# Patient Record
Sex: Male | Born: 1985 | Race: White | Hispanic: No | Marital: Single | Smoking: Current every day smoker
Health system: Southern US, Community
[De-identification: ages and names within clinical notes are randomized; demographics above are authoritative.]

## PROBLEM LIST (undated history)

## (undated) DIAGNOSIS — F3130 Bipolar disorder, current episode depressed, mild or moderate severity, unspecified: Secondary | ICD-10-CM

## (undated) DIAGNOSIS — F15950 Other stimulant use, unspecified with stimulant-induced psychotic disorder with delusions: Secondary | ICD-10-CM

## (undated) DIAGNOSIS — B1920 Unspecified viral hepatitis C without hepatic coma: Secondary | ICD-10-CM

## (undated) DIAGNOSIS — F319 Bipolar disorder, unspecified: Secondary | ICD-10-CM

## (undated) HISTORY — DX: Other stimulant use, unspecified with stimulant-induced psychotic disorder with delusions: F15.950

## (undated) HISTORY — PX: OTHER SURGICAL HISTORY: SHX169

## (undated) HISTORY — DX: Bipolar disorder, current episode depressed, mild or moderate severity, unspecified: F31.30

## (undated) HISTORY — PX: HERNIA REPAIR: SHX51

---

## 2020-08-15 ENCOUNTER — Emergency Department (HOSPITAL_BASED_OUTPATIENT_CLINIC_OR_DEPARTMENT_OTHER): Payer: Self-pay

## 2020-08-15 ENCOUNTER — Emergency Department (HOSPITAL_BASED_OUTPATIENT_CLINIC_OR_DEPARTMENT_OTHER)
Admission: EM | Admit: 2020-08-15 | Discharge: 2020-08-15 | Disposition: A | Discharge status: Home / Self Care | Payer: Self-pay | Attending: Emergency Medicine | Admitting: Emergency Medicine

## 2020-08-15 ENCOUNTER — Encounter (HOSPITAL_BASED_OUTPATIENT_CLINIC_OR_DEPARTMENT_OTHER): Payer: Self-pay | Admitting: Emergency Medicine

## 2020-08-15 ENCOUNTER — Other Ambulatory Visit: Payer: Self-pay

## 2020-08-15 DIAGNOSIS — F17200 Nicotine dependence, unspecified, uncomplicated: Secondary | ICD-10-CM | POA: Insufficient documentation

## 2020-08-15 DIAGNOSIS — L03211 Cellulitis of face: Secondary | ICD-10-CM | POA: Insufficient documentation

## 2020-08-15 LAB — CBC WITH DIFFERENTIAL/PLATELET
Abs Immature Granulocytes: 0.05 10*3/uL (ref 0.00–0.07)
Basophils Absolute: 0.1 10*3/uL (ref 0.0–0.1)
Basophils Relative: 0 %
Eosinophils Absolute: 0.1 10*3/uL (ref 0.0–0.5)
Eosinophils Relative: 0 %
HCT: 50.6 % (ref 39.0–52.0)
Hemoglobin: 16.6 g/dL (ref 13.0–17.0)
Immature Granulocytes: 0 %
Lymphocytes Relative: 16 %
Lymphs Abs: 2.2 10*3/uL (ref 0.7–4.0)
MCH: 28.5 pg (ref 26.0–34.0)
MCHC: 32.8 g/dL (ref 30.0–36.0)
MCV: 86.9 fL (ref 80.0–100.0)
Monocytes Absolute: 1.3 10*3/uL — ABNORMAL HIGH (ref 0.1–1.0)
Monocytes Relative: 9 %
Neutro Abs: 9.8 10*3/uL — ABNORMAL HIGH (ref 1.7–7.7)
Neutrophils Relative %: 75 %
Platelets: 192 10*3/uL (ref 150–400)
RBC: 5.82 MIL/uL — ABNORMAL HIGH (ref 4.22–5.81)
RDW: 13.5 % (ref 11.5–15.5)
WBC: 13.4 10*3/uL — ABNORMAL HIGH (ref 4.0–10.5)
nRBC: 0 % (ref 0.0–0.2)

## 2020-08-15 LAB — BASIC METABOLIC PANEL
Anion gap: 11 (ref 5–15)
BUN: 12 mg/dL (ref 6–20)
CO2: 29 mmol/L (ref 22–32)
Calcium: 9.6 mg/dL (ref 8.9–10.3)
Chloride: 96 mmol/L — ABNORMAL LOW (ref 98–111)
Creatinine, Ser: 0.83 mg/dL (ref 0.61–1.24)
GFR calc Af Amer: >60 mL/min (ref >60)
GFR calc non Af Amer: >60 mL/min (ref >60)
Glucose, Bld: 101 mg/dL — ABNORMAL HIGH (ref 70–99)
Potassium: 4.2 mmol/L (ref 3.5–5.1)
Sodium: 136 mmol/L (ref 135–145)

## 2020-08-15 MED ORDER — IOHEXOL 300 MG/ML  SOLN
100.0000 mL | Freq: Once | INTRAMUSCULAR | Status: AC | PRN
  Administered 2020-08-15: 75 mL via INTRAVENOUS

## 2020-08-15 MED ORDER — ACETAMINOPHEN 325 MG PO TABS
650.0000 mg | ORAL_TABLET | Freq: Once | ORAL | Status: AC
  Administered 2020-08-15: 650 mg via ORAL
  Filled 2020-08-15: qty 2

## 2020-08-15 MED ORDER — BENZOCAINE 20 % MT AERO
INHALATION_SPRAY | OROMUCOSAL | Status: AC
  Filled 2020-08-15: qty 57

## 2020-08-15 MED ORDER — CLINDAMYCIN PHOSPHATE 600 MG/50ML IV SOLN
600.0000 mg | Freq: Once | INTRAVENOUS | Status: AC
  Administered 2020-08-15: 600 mg via INTRAVENOUS
  Filled 2020-08-15: qty 50

## 2020-08-15 MED ORDER — LIDOCAINE-EPINEPHRINE 2 %-1:100000 IJ SOLN
INTRAMUSCULAR | Status: AC
  Filled 2020-08-15: qty 1.7

## 2020-08-15 MED ORDER — CLINDAMYCIN HCL 150 MG PO CAPS: 450.0000 mg | ORAL_CAPSULE | Freq: Three times a day (TID) | ORAL | 0 refills | Status: AC

## 2020-08-15 MED ORDER — OXYCODONE-ACETAMINOPHEN 5-325 MG PO TABS
1.0000 | ORAL_TABLET | Freq: Once | ORAL | Status: AC
  Administered 2020-08-15: 1 via ORAL
  Filled 2020-08-15: qty 1

## 2020-08-15 NOTE — ED Triage Notes (Addendum)
Redness and swelling around R eye and to the R cheek since Monday with pain. Pt states he cannot see the periphery of R eye. Pt reports that this could be cause by his bad teeth.

## 2020-08-15 NOTE — ED Provider Notes (Signed)
Medical screening examination/treatment/procedure(s) were conducted as a shared visit with non-physician practitioner(s) and myself.  I personally evaluated the patient during the encounter. Briefly, the patient is a 34 y.o. male patient with right-sided facial swelling for the last several days.  Patient does admit to meth use.  Has bad dentition on exam.  Multiple dental caries.  He has pus actively draining from a tooth with severe decay.  He has swelling up into the right cheek but does not have appear to involve the eye.  This is likely odontogenic infection.  We will get a CT scan to further evaluate and will have my PA attempt an I&D at the gumline did help relieve some more pus but will need dentistry follow-up.  Will give a dose of IV clindamycin and check basic labs but believe he will need his teeth pulled.  Does not appear to have any kind of deeper space infection but will confirm with CT scan.  Vital signs are normal.  Low concern for sepsis.  Anticipate discharge on antibiotics with dentistry follow-up.  This chart was dictated using voice recognition software.  Despite best efforts to proofread,  errors can occur which can change the documentation meaning.     EKG Interpretation None           Virgina Norfolk, DO 08/15/20 1438

## 2020-08-15 NOTE — Discharge Instructions (Signed)
You were seen in the ED for right facial swelling.  You have an abscess at the top of your tooth extending into your face.  This was drained today.  You were given antibiotics.  Please do warm/hot water and salt rinses every 2-3 hours and as much as possible.  Massage face and gumline under hot water/shower to help with drainage.  Sleep with your head elevated at night to help with swelling.  Take clindamycin 450 mg every 8 hours for infection.  Take 600 mg of ibuprofen every 6 hours for pain.  You can add 500 to 1000 mg of acetaminophen every 6-8 hours for more pain control.  Please follow-up with dentist as soon as possible to make sure this infection is healing, they may need to repeat the drainage procedure in the office.  Return to the emergency department if you develop fever greater than 100.4, worsening severe facial swelling that has involved the eye lid or eye tissues

## 2020-08-15 NOTE — ED Provider Notes (Signed)
[facility] EMERGENCY DEPARTMENT Provider Note   CSN: [id number] Arrival date & time: 08/15/20  1141     History Chief Complaint  Patient presents with  . Facial Swelling    Edward Wolfe is a 34 y.o. male With history of methamphetamine use presents to the ED for evaluation of right-sided facial and nasal swelling associated with pain, redness and warmth.  States he had a fever up until yesterday but this has resolved.  Has chills.  Has noticed some drainage coming from one of his teeth in the right upper gumline with surrounding gumline tenderness as well.  The pain is constant and severe and throbbing.  Attributes his poor dentition to using methamphetamine for years.  States he has been clean for approximately 2 months.  Denies nasal or eye drainage.  Denies pain with eye movements.  Has been taking extra strength Tylenol for pain.  No other associate symptoms.  HPI     History reviewed. No pertinent past medical history.  There are no problems to display for this patient.   History reviewed. No pertinent surgical history.     No family history on file.  Social History   Tobacco Use  . Smoking status: Current Every Day Smoker  . Smokeless tobacco: Never Used  Substance Use Topics  . Alcohol use: Yes  . Drug use: Yes    Types: Marijuana    Home Medications Prior to Admission medications   Medication Sig Start Date End Date Taking? Authorizing Provider  clindamycin (CLEOCIN) 150 MG capsule Take 3 capsules (450 mg total) by mouth 3 (three) times daily for 10 days. 08/15/20 08/25/20  Liberty Handy, PA-C    Allergies    Morphine and related  Review of Systems   Review of Systems  HENT: Positive for dental problem and facial swelling.   All other systems reviewed and are negative.   Physical Exam Updated Vital Signs BP 125/73 (BP Location: Right Arm)   Pulse 72   Temp 98.2 F (36.8 C) (Oral)   Resp 16   Ht 6' (1.829 m)   Wt 83.9 kg   SpO2  95%   BMI 25.09 kg/m   Physical Exam Vitals and nursing note reviewed.  Constitutional:      General: He is not in acute distress.    Appearance: He is well-developed.     Comments: NAD.  HENT:     Head: Normocephalic and atraumatic.     Right Ear: External ear normal.     Left Ear: External ear normal.     Nose: Nose normal.      Comments: Approximate 3 x 2 area of exquisite tenderness, erythema, warmth and fluctuance over right maxilla, slightly extending to the right inferior eyelid.  There is tenderness inside the right nare but no obvious signs of abscess intranasally.    Mouth/Throat:     Comments: Severely decayed teeth and poor dentition, several missing teeth.  Tooth #8 is essentially completely evaluated down to the root, pulp is exposed and exquisitely tender.  There is purulent drainage coming out of the base of this tooth.  Focal tenderness, fluctuance and edema of the gingiva at the base of tooth #8 noted. Eyes:     General: No scleral icterus.    Conjunctiva/sclera: Conjunctivae normal.     Comments: Very mild extension of cellulitis, edema from the right maxilla to the right inferior eyelid, just shy of the eyelashes.  Not involving the lid margins.  Full  range motion of the eyes without any pain.  Conjunctiva and sclera normal.  Cardiovascular:     Rate and Rhythm: Normal rate and regular rhythm.     Heart sounds: Normal heart sounds. No murmur heard.   Pulmonary:     Effort: Pulmonary effort is normal.     Breath sounds: Normal breath sounds. No wheezing.  Musculoskeletal:        General: No deformity. Normal range of motion.     Cervical back: Normal range of motion and neck supple.  Skin:    General: Skin is warm and dry.     Capillary Refill: Capillary refill takes less than 2 seconds.  Neurological:     Mental Status: He is alert and oriented to person, place, and time.  Psychiatric:        Behavior: Behavior normal.        Thought Content: Thought  content normal.        Judgment: Judgment normal.       ED Results / Procedures / Treatments   Labs (all labs ordered are listed, but only abnormal results are displayed) Labs Reviewed  CBC WITH DIFFERENTIAL/PLATELET - Abnormal; Notable for the following components:      Result Value   WBC 13.4 (*)    RBC 5.82 (*)    Neutro Abs 9.8 (*)    Monocytes Absolute 1.3 (*)    All other components within normal limits  BASIC METABOLIC PANEL - Abnormal; Notable for the following components:   Chloride 96 (*)    Glucose, Bld 101 (*)    All other components within normal limits    EKG None  Radiology CT Maxillofacial W Contrast  Result Date: 08/15/2020 CLINICAL DATA:  Maxillary/facial abscess. Right maxillary facial swelling. Purulent drainage. EXAM: CT MAXILLOFACIAL WITH CONTRAST TECHNIQUE: Multidetector CT imaging of the maxillofacial structures was performed with intravenous contrast. Multiplanar CT image reconstructions were also generated. CONTRAST:  33mL OMNIPAQUE IOHEXOL 300 MG/ML  SOLN COMPARISON:  None. FINDINGS: There is an approximately 0.9 x 1.7 x 1.4 (AP by transverse by craniocaudal) fluid collection overlying the right maxillary alveolar process, extending superiorly to the level of the inferior nasal arch, which is compatible with an abscess. There is extensive surrounding phlegmonous change as well as fat stranding/cellulitis that extends inferiorly into the right neck along the platysma which is thickened. Inflammatory change extends to the medial left orbit without evidence of postseptal cellulitis. No evidence of substantial floor mouth component. There multiple enlarged right greater than left lymph nodes, likely reactive. No evidence of suppurative change. There is extensive dental disease with numerous maxillary and mandibular periapical lucencies and caries. 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 The right second premolar  has a large periapical lucency with overlying cortical dehiscence (series 3, image 40). Mild inferior right maxillary sinus mucosal thickening, likely odontogenic given adjacent periapical lucencies. Otherwise, the sinuses are clear without air-fluid level. Limited intracranial evaluation demonstrates no significant or unexpected finding. IMPRESSION: 1. Findings consistent with a soft tissue abscess overlying the right maxillary alveolar process, as measured above. Extensive surrounding phlegmon and cellulitis which extends superiorly to the nasal bridge/medial canthus (without evidence of postseptal orbital cellulitis) and inferiorly into the subcutaneous soft tissues of the right upper neck. 2. Extensive dental disease with periapical lucencies, caries, and fragmented teeth. The right second premolar has a large periapical lucency with overlying cortical dehiscence, making it a candidate for the source of infection. 3. Likely odontogenic right  inferior maxillary sinus mucosal thickening. Critical Value/emergent results were called by telephone at the time of interpretation on 08/15/2020 at 3:18 pm to provider Dr. Lockie Mola, who verbally acknowledged these results. Electronically Signed   By: Feliberto Harts MD   On: 08/15/2020 15:23    Procedures Procedures (including critical care time)  Medications Ordered in ED Medications  lidocaine-EPINEPHrine (XYLOCAINE W/EPI) 2 %-1:100000 (with pres) injection (has no administration in time range)  Benzocaine (HURRCAINE) 20 % mouth spray (  Given by Other 08/15/20 1723)  iohexol (OMNIPAQUE) 300 MG/ML solution 100 mL (75 mLs Intravenous Contrast Given 08/15/20 1436)  oxyCODONE-acetaminophen (PERCOCET/ROXICET) 5-325 MG per tablet 1 tablet (1 tablet Oral Given 08/15/20 1444)  acetaminophen (TYLENOL) tablet 650 mg (650 mg Oral Given 08/15/20 1444)  clindamycin (CLEOCIN) IVPB 600 mg (0 mg Intravenous Stopped 08/15/20 1723)    ED Course  I have reviewed the triage  vital signs and the nursing notes.  Pertinent labs & imaging results that were available during my care of the patient were reviewed by me and considered in my medical decision making (see chart for details).    MDM Rules/Calculators/A&P                          34 year old male with history of methamphetamine use presents for right facial swelling.  Exam reveals moderate right maxillary swelling with exquisite tenderness, warmth and erythema consistent with cellulitis and likely abscess secondary to dental source.  He has extensive dental decay.  Maximum point of tenderness is about the canine/first right molar.  He has purulent drainage coming through tooth #8.  I have ordered lab work, CT maxillofacial.  I have ordered Percocet, clindamycin.  Lab work with very mild leukocytosis but no other evidence of SIRS.  Vital signs normal.  CT confirms abscess from odontogenic source.  Incision and drainage and dental block performed by me with complete anesthesia obtained if patient significant pain relief.  Significant amount of purulent drainage and blood expressed with improvement and facial swelling as well.  No immediate complications after no fever here in the ED.  We will discharge patient with warm water/salt rinses, high-dose NSAIDs, warm facial massage under shower, clindamycin.  I gave him Dr. Mart Piggs contact information and advised him to follow-up in the office in the next 48 hours for reevaluation to ensure that symptoms are improving.  Discussed specific reasons to return to the ED.  Today there is no evidence of preseptal or orbital cellulitis.  He has full range of motion of his eyes without any pain.  No entrapment.  Shared with EDP.  Final Clinical Impression(s) / ED Diagnoses Final diagnoses:  Facial cellulitis    Rx / DC Orders ED Discharge Orders         Ordered    clindamycin (CLEOCIN) 150 MG capsule  3 times daily        08/15/20 1713           Liberty Handy, PA-C 08/15/20 1723    Virgina Norfolk, DO 08/19/20 [phone]

## 2021-01-16 ENCOUNTER — Encounter (HOSPITAL_COMMUNITY): Payer: Self-pay

## 2021-01-16 ENCOUNTER — Telehealth (HOSPITAL_COMMUNITY): Payer: No Payment, Other | Admitting: Professional

## 2021-01-16 ENCOUNTER — Other Ambulatory Visit: Payer: Self-pay

## 2021-01-16 ENCOUNTER — Ambulatory Visit (HOSPITAL_COMMUNITY)
Admission: EM | Admit: 2021-01-16 | Discharge: 2021-01-17 | Disposition: A | Discharge status: Home / Self Care | Payer: No Payment, Other | Attending: Family | Admitting: Family

## 2021-01-16 ENCOUNTER — Telehealth (HOSPITAL_COMMUNITY): Payer: Self-pay | Admitting: Professional

## 2021-01-16 ENCOUNTER — Telehealth (HOSPITAL_COMMUNITY): Payer: Self-pay | Admitting: Psychiatry

## 2021-01-16 DIAGNOSIS — F15950 Other stimulant use, unspecified with stimulant-induced psychotic disorder with delusions: Secondary | ICD-10-CM | POA: Insufficient documentation

## 2021-01-16 DIAGNOSIS — F17200 Nicotine dependence, unspecified, uncomplicated: Secondary | ICD-10-CM | POA: Insufficient documentation

## 2021-01-16 DIAGNOSIS — F3130 Bipolar disorder, current episode depressed, mild or moderate severity, unspecified: Secondary | ICD-10-CM | POA: Insufficient documentation

## 2021-01-16 DIAGNOSIS — R45851 Suicidal ideations: Secondary | ICD-10-CM | POA: Insufficient documentation

## 2021-01-16 DIAGNOSIS — F1994 Other psychoactive substance use, unspecified with psychoactive substance-induced mood disorder: Secondary | ICD-10-CM | POA: Insufficient documentation

## 2021-01-16 DIAGNOSIS — F1290 Cannabis use, unspecified, uncomplicated: Secondary | ICD-10-CM | POA: Insufficient documentation

## 2021-01-16 DIAGNOSIS — Z20822 Contact with and (suspected) exposure to covid-19: Secondary | ICD-10-CM | POA: Insufficient documentation

## 2021-01-16 DIAGNOSIS — F332 Major depressive disorder, recurrent severe without psychotic features: Secondary | ICD-10-CM

## 2021-01-16 DIAGNOSIS — Z9151 Personal history of suicidal behavior: Secondary | ICD-10-CM | POA: Diagnosis not present

## 2021-01-16 DIAGNOSIS — F1520 Other stimulant dependence, uncomplicated: Secondary | ICD-10-CM | POA: Insufficient documentation

## 2021-01-16 LAB — MAGNESIUM: Magnesium: 2.1 mg/dL (ref 1.7–2.4)

## 2021-01-16 LAB — CBC WITH DIFFERENTIAL/PLATELET
Abs Immature Granulocytes: 0.03 10*3/uL (ref 0.00–0.07)
Basophils Absolute: 0.1 10*3/uL (ref 0.0–0.1)
Basophils Relative: 0 %
Eosinophils Absolute: 0.1 10*3/uL (ref 0.0–0.5)
Eosinophils Relative: 0 %
HCT: 53.9 % — ABNORMAL HIGH (ref 39.0–52.0)
Hemoglobin: 17.5 g/dL — ABNORMAL HIGH (ref 13.0–17.0)
Immature Granulocytes: 0 %
Lymphocytes Relative: 17 %
Lymphs Abs: 2.3 10*3/uL (ref 0.7–4.0)
MCH: 27.9 pg (ref 26.0–34.0)
MCHC: 32.5 g/dL (ref 30.0–36.0)
MCV: 86 fL (ref 80.0–100.0)
Monocytes Absolute: 0.9 10*3/uL (ref 0.1–1.0)
Monocytes Relative: 7 %
Neutro Abs: 9.9 10*3/uL — ABNORMAL HIGH (ref 1.7–7.7)
Neutrophils Relative %: 76 %
Platelets: 154 10*3/uL (ref 150–400)
RBC: 6.27 MIL/uL — ABNORMAL HIGH (ref 4.22–5.81)
RDW: 13.5 % (ref 11.5–15.5)
WBC: 13.2 10*3/uL — ABNORMAL HIGH (ref 4.0–10.5)
nRBC: 0 % (ref 0.0–0.2)

## 2021-01-16 LAB — COMPREHENSIVE METABOLIC PANEL
ALT: 31 U/L (ref 0–44)
AST: 25 U/L (ref 15–41)
Albumin: 4.7 g/dL (ref 3.5–5.0)
Alkaline Phosphatase: 52 U/L (ref 38–126)
Anion gap: 13 (ref 5–15)
BUN: 9 mg/dL (ref 6–20)
CO2: 27 mmol/L (ref 22–32)
Calcium: 9.9 mg/dL (ref 8.9–10.3)
Chloride: 100 mmol/L (ref 98–111)
Creatinine, Ser: 0.88 mg/dL (ref 0.61–1.24)
GFR, Estimated: >60 mL/min (ref >60)
Glucose, Bld: 77 mg/dL (ref 70–99)
Potassium: 4.4 mmol/L (ref 3.5–5.1)
Sodium: 140 mmol/L (ref 135–145)
Total Bilirubin: 0.9 mg/dL (ref 0.3–1.2)
Total Protein: 7.4 g/dL (ref 6.5–8.1)

## 2021-01-16 LAB — HEMOGLOBIN A1C
Hgb A1c MFr Bld: 5.3 % (ref 4.8–5.6)
Mean Plasma Glucose: 105.41 mg/dL

## 2021-01-16 LAB — POC SARS CORONAVIRUS 2 AG: SARS Coronavirus 2 Ag: NEGATIVE

## 2021-01-16 LAB — LIPID PANEL
Cholesterol: 236 mg/dL — ABNORMAL HIGH (ref 0–200)
HDL: 50 mg/dL (ref >40)
LDL Cholesterol: 157 mg/dL — ABNORMAL HIGH (ref 0–99)
Total CHOL/HDL Ratio: 4.7 RATIO
Triglycerides: 143 mg/dL (ref <150)
VLDL: 29 mg/dL (ref 0–40)

## 2021-01-16 LAB — RESP PANEL BY RT-PCR (FLU A&B, COVID) ARPGX2
Influenza A by PCR: NEGATIVE
Influenza B by PCR: NEGATIVE
SARS Coronavirus 2 by RT PCR: NEGATIVE

## 2021-01-16 LAB — TSH: TSH: 2.726 u[IU]/mL (ref 0.350–4.500)

## 2021-01-16 LAB — POCT URINE DRUG SCREEN - MANUAL ENTRY (I-SCREEN)
POC Amphetamine UR: NOT DETECTED
POC Buprenorphine (BUP): NOT DETECTED
POC Cocaine UR: NOT DETECTED
POC Marijuana UR: POSITIVE — AB
POC Methadone UR: NOT DETECTED
POC Methamphetamine UR: NOT DETECTED
POC Morphine: NOT DETECTED
POC Oxazepam (BZO): NOT DETECTED
POC Oxycodone UR: NOT DETECTED
POC Secobarbital (BAR): NOT DETECTED

## 2021-01-16 LAB — ETHANOL: Alcohol, Ethyl (B): <10 mg/dL (ref <10)

## 2021-01-16 MED ORDER — SERTRALINE HCL 50 MG PO TABS
50.0000 mg | ORAL_TABLET | Freq: Every day | ORAL | Status: DC
  Administered 2021-01-16 – 2021-01-17 (×2): 50 mg via ORAL
  Filled 2021-01-16 (×2): qty 1
  Filled 2021-01-16: qty 7

## 2021-01-16 MED ORDER — NICOTINE 14 MG/24HR TD PT24
14.0000 mg | MEDICATED_PATCH | Freq: Once | TRANSDERMAL | Status: DC
  Administered 2021-01-16: 14 mg via TRANSDERMAL
  Filled 2021-01-16: qty 1

## 2021-01-16 MED ORDER — TRAZODONE HCL 50 MG PO TABS
50.0000 mg | ORAL_TABLET | Freq: Every evening | ORAL | Status: DC | PRN
  Administered 2021-01-16: 50 mg via ORAL
  Filled 2021-01-16: qty 7
  Filled 2021-01-16: qty 1

## 2021-01-16 MED ORDER — MAGNESIUM HYDROXIDE 400 MG/5ML PO SUSP: 30.0000 mL | Freq: Every day | ORAL | Status: DC | PRN

## 2021-01-16 MED ORDER — ALUM & MAG HYDROXIDE-SIMETH 200-200-20 MG/5ML PO SUSP: 30.0000 mL | ORAL | Status: DC | PRN

## 2021-01-16 MED ORDER — HYDROXYZINE HCL 25 MG PO TABS
25.0000 mg | ORAL_TABLET | Freq: Three times a day (TID) | ORAL | Status: DC | PRN
  Administered 2021-01-16: 25 mg via ORAL
  Filled 2021-01-16: qty 1

## 2021-01-16 MED ORDER — ACETAMINOPHEN 325 MG PO TABS: 650.0000 mg | ORAL_TABLET | Freq: Four times a day (QID) | ORAL | Status: DC | PRN

## 2021-01-16 NOTE — ED Provider Notes (Signed)
Behavioral Health Admission [facility] & OBS)  Date: 01/16/21 Patient Name: Edward Wolfe MRN: [id number] Chief Complaint:  Chief Complaint  Patient presents with  . Addiction Problem  . Suicidal      Diagnoses:  Final diagnoses:  Substance induced mood disorder (HCC)    HPI: Patient presents voluntarily to [facility] behavioral health center for walk-in assessment. Patient endorses suicidal ideations with a plan to hang himself.  Patient endorses 2 prior suicide attempts in the past.  Patient endorses history of self-harm, cutting in the past.  Patient reports recent stressors include unemployment and not getting along well with roommate.  Patient reports also he continues to grieve the death of his grandmother who passed away approximately 1 year ago.  Patient reports recent break-up with boyfriend, reports this boyfriend was the cause for patient's relapse on methamphetamine.  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  Patient reports relapse on methamphetamine approximately 3 weeks ago.  Patient denies any current outpatient psychiatry follow-up.  Patient reports he has been followed outpatient by psychiatry, reports Zoloft has worked well for him in the past.  Patient denies any history of substance use treatment.  Patient resides in [place] with a roommate.  Patient denies access to weapons.  Patient is currently not employed but typically works in the [employer].  Patient endorses daily use of marijuana.  Patient endorses rare use of alcohol.  Patient endorses decreased sleep and average appetite.  Patient assessed by nurse practitioner.  Patient alert and oriented, answers appropriately.  Patient pleasant cooperative during assessment.  Patient denies homicidal ideations.  Patient denies both auditory and visual hallucinations.  There is no evidence of delusional thought content and no indication that patient is responding to internal stimuli.  Patient  denies symptoms of paranoia.  Patient offered support and encouragement.  PHQ 2-9:  Flowsheet Row ED from 01/16/2021 in [facility]  Thoughts that you would be better off dead, or of hurting yourself in some way Several days  [Phreesia 01/16/2021]  PHQ-9 Total Score 9      Flowsheet Row ED from 01/16/2021 in [facility]  C-SSRS RISK CATEGORY High Risk       Total Time spent with patient: 30 minutes  Musculoskeletal  Strength & Muscle Tone: within normal limits Gait & Station: normal Patient leans: N/A  Psychiatric Specialty Exam  Presentation General Appearance: Appropriate for Environment; Casual  Eye Contact:Good  Speech:Clear and Coherent; Normal Rate  Speech Volume:Normal  Handedness:Right   Mood and Affect  Mood:Depressed  Affect:Congruent; Depressed   Thought Process  Thought Processes:Coherent; Goal Directed  Descriptions of Associations:Intact  Orientation:Full (Time, Place and Person)  Thought Content:Logical  Hallucinations:Hallucinations: None  Ideas of Reference:None  Suicidal Thoughts:Suicidal Thoughts: Yes, Active SI Active Intent and/or Plan: With Intent; With Plan; With Access to Means  Homicidal Thoughts:Homicidal Thoughts: No   Sensorium  Memory:Immediate Good; Recent Good; Remote Good  Judgment:Fair  Insight:Fair   Executive Functions  Concentration:Good  Attention Span:Good  Recall:Good  Fund of Knowledge:Good  Language:Good   Psychomotor Activity  Psychomotor Activity:Psychomotor Activity: Normal   Assets  Assets:Communication Skills; Desire for Improvement; Financial Resources/Insurance; Intimacy; Leisure Time; Physical Health; Resilience; Social Support   Sleep  Sleep:Sleep: Fair   Physical Exam Vitals and nursing note reviewed.  Constitutional:      Appearance: He is well-developed.  HENT:     Head: Normocephalic.  Cardiovascular:     Rate  and Rhythm: Normal  rate.  Pulmonary:     Effort: Pulmonary effort is normal.  Neurological:     Mental Status: He is alert and oriented to person, place, and time.  Psychiatric:        Attention and Perception: Attention and perception normal.        Mood and Affect: Affect normal. Mood is depressed.        Speech: Speech normal.        Behavior: Behavior normal. Behavior is cooperative.        Thought Content: Thought content includes suicidal ideation. Thought content includes suicidal plan.        Cognition and Memory: Cognition and memory normal.        Judgment: Judgment normal.    Review of Systems  Constitutional: Negative.   HENT: Negative.   Eyes: Negative.   Respiratory: Negative.   Cardiovascular: Negative.   Gastrointestinal: Negative.   Genitourinary: Negative.   Musculoskeletal: Negative.   Skin: Negative.   Neurological: Negative.   Endo/Heme/Allergies: Negative.   Psychiatric/Behavioral: Positive for depression, substance abuse and suicidal ideas.    Blood pressure 129/77, pulse 78, temperature 98.5 F (36.9 C), temperature source Oral, resp. rate 20, SpO2 100 %. There is no height or weight on file to calculate BMI.  Past Psychiatric History: Methamphetamine use disorder  Is the patient at risk to self? Yes  Has the patient been a risk to self in the past 6 months? No .    Has the patient been a risk to self within the distant past? Yes   Is the patient a risk to others? No   Has the patient been a risk to others in the past 6 months? No   Has the patient been a risk to others within the distant past? No   Past Medical History: History reviewed. No pertinent past medical history. History reviewed. No pertinent surgical history.  Family History: History reviewed. No pertinent family history.  Social History:  Social History   Socioeconomic History  . Marital status: Single    Spouse name: Not on file  . Number of children: Not on file  . Years of  education: Not on file  . Highest education level: Not on file  Occupational History  . Not on file  Tobacco Use  . Smoking status: Current Every Day Smoker  . Smokeless tobacco: Never Used  Substance and Sexual Activity  . Alcohol use: Yes  . Drug use: Yes    Types: Marijuana  . Sexual activity: Not on file  Other Topics Concern  . Not on file  Social History Narrative  . Not on file   Social Determinants of Health   Financial Resource Strain: Not on file  Food Insecurity: Not on file  Transportation Needs: Not on file  Physical Activity: Not on file  Stress: Not on file  Social Connections: Not on file  Intimate Partner Violence: Not on file    SDOH:  SDOH Screenings   Alcohol Screen: Not on file  Depression (PHQ2-9): Medium Risk  . PHQ-2 Score: 9  Financial Resource Strain: Not on file  Food Insecurity: Not on file  Housing: Not on file  Physical Activity: Not on file  Social Connections: Not on file  Stress: Not on file  Tobacco Use: High Risk  . Smoking Tobacco Use: Current Every Day Smoker  . Smokeless Tobacco Use: Never Used  Transportation Needs: Not on file    Last Labs:  Admission on 01/16/2021  Component Date Value Ref Range Status  . POC Amphetamine UR 01/16/2021 None Detected  NONE DETECTED (Cut Off Level 1000 ng/mL) Final  . POC Secobarbital (BAR) 01/16/2021 None Detected  NONE DETECTED (Cut Off Level 300 ng/mL) Final  . POC Buprenorphine (BUP) 01/16/2021 None Detected  NONE DETECTED (Cut Off Level 10 ng/mL) Final  . POC Oxazepam (BZO) 01/16/2021 None Detected  NONE DETECTED (Cut Off Level 300 ng/mL) Final  . POC Cocaine UR 01/16/2021 None Detected  NONE DETECTED (Cut Off Level 300 ng/mL) Final  . POC Methamphetamine UR 01/16/2021 None Detected  NONE DETECTED (Cut Off Level 1000 ng/mL) Final  . POC Morphine 01/16/2021 None Detected  NONE DETECTED (Cut Off Level 300 ng/mL) Final  . POC Oxycodone UR 01/16/2021 None Detected  NONE DETECTED (Cut Off  Level 100 ng/mL) Final  . POC Methadone UR 01/16/2021 None Detected  NONE DETECTED (Cut Off Level 300 ng/mL) Final  . POC Marijuana UR 01/16/2021 Positive* NONE DETECTED (Cut Off Level 50 ng/mL) Final  . SARS Coronavirus 2 Ag 01/16/2021 NEGATIVE  NEGATIVE Final   Comment: (NOTE) SARS-CoV-2 antigen NOT DETECTED.   Negative results are presumptive.  Negative results do not preclude SARS-CoV-2 infection and should not be used as the sole basis for treatment or other patient management decisions, including infection  control decisions, particularly in the presence of clinical signs and  symptoms consistent with COVID-19, or in those who have been in contact with the virus.  Negative results must be combined with clinical observations, patient history, and epidemiological information. The expected result is Negative.  Fact Sheet for Patients: https://www.jennings-kim.com/  Fact Sheet for Healthcare Providers: https://alexander-rogers.biz/  This test is not yet approved or cleared by the [place] FDA and  has been authorized for detection and/or diagnosis of SARS-CoV-2 by FDA under an Emergency Use Authorization (EUA).  This EUA will remain in effect (meaning this test can be used) for the duration of  the COV                          ID-19 declaration under Section 564(b)(1) of the Act, 21 U.S.C. section 360bbb-3(b)(1), unless the authorization is terminated or revoked sooner.    Admission on 08/15/2020, Discharged on 08/15/2020  Component Date Value Ref Range Status  . WBC 08/15/2020 13.4* 4.0 - 10.5 K/uL Final  . RBC 08/15/2020 5.82* 4.22 - 5.81 MIL/uL Final  . Hemoglobin 08/15/2020 16.6  13.0 - 17.0 g/dL Final  . HCT 02/77/4128 50.6  39.0 - 52.0 % Final  . MCV 08/15/2020 86.9  80.0 - 100.0 fL Final  . MCH 08/15/2020 28.5  26.0 - 34.0 pg Final  . MCHC 08/15/2020 32.8  30.0 - 36.0 g/dL Final  . RDW 78/67/6720 13.5  11.5 - 15.5 % Final  . Platelets  08/15/2020 192  150 - 400 K/uL Final  . nRBC 08/15/2020 0.0  0.0 - 0.2 % Final  . Neutrophils Relative % 08/15/2020 75  % Final  . Neutro Abs 08/15/2020 9.8* 1.7 - 7.7 K/uL Final  . Lymphocytes Relative 08/15/2020 16  % Final  . Lymphs Abs 08/15/2020 2.2  0.7 - 4.0 K/uL Final  . Monocytes Relative 08/15/2020 9  % Final  . Monocytes Absolute 08/15/2020 1.3* 0.1 - 1.0 K/uL Final  . Eosinophils Relative 08/15/2020 0  % Final  . Eosinophils Absolute 08/15/2020 0.1  0.0 - 0.5 K/uL Final  . Basophils Relative 08/15/2020 0  % Final  .  Basophils Absolute 08/15/2020 0.1  0.0 - 0.1 K/uL Final  . Immature Granulocytes 08/15/2020 0  % Final  . Abs Immature Granulocytes 08/15/2020 0.05  0.00 - 0.07 K/uL Final   Performed at [facility], [address] [place], [place] [address]  . Sodium 08/15/2020 136  135 - 145 mmol/L Final  . Potassium 08/15/2020 4.2  3.5 - 5.1 mmol/L Final  . Chloride 08/15/2020 96* 98 - 111 mmol/L Final  . CO2 08/15/2020 29  22 - 32 mmol/L Final  . Glucose, Bld 08/15/2020 101* 70 - 99 mg/dL Final   Glucose reference range applies only to samples taken after fasting for at least 8 hours.  . BUN 08/15/2020 12  6 - 20 mg/dL Final  . Creatinine, Ser 08/15/2020 0.83  0.61 - 1.24 mg/dL Final  . Calcium 59/29/2446 9.6  8.9 - 10.3 mg/dL Final  . GFR calc non Af Amer 08/15/2020 >60  >60 mL/min Final  . GFR calc Af Amer 08/15/2020 >60  >60 mL/min Final  . Anion gap 08/15/2020 11  5 - 15 Final   Performed at [facility], [address] [place], [place] [address]    Allergies: Morphine and related  PTA Medications: (Not in a hospital admission)   Medical Decision Making  Discussed initiating Zoloft to address mood.  Discussed side effects, patient agrees with plan.  Patient requests nicotine patch as he is currently on every day smoker.  Medications: -Zoloft 50 mg daily -Nicotine transdermal 14 mg daily   EKG completed, QTC 497. Recommendations   Based on my evaluation the patient does not appear to have an emergency medical condition.  Patient reviewed with Dr. Bronwen Betters.  Patient will be placed in the continuous assessment area at [facility] for treatment and stabilization.  Patient will be reevaluated tomorrow, disposition will be determined at that time.   Patrcia Dolly, FNP 01/16/21  7:02 PM

## 2021-01-16 NOTE — Telephone Encounter (Signed)
Patient called [facility] around 3pm to check on his appointment at 2pm. He stated that he had been waiting to connect with a therapist to have an assessment done. Writer informed patient that therapist reached out to patient by phone, as well as waiting on MyChart video to connect. Patient never connected and writer was informed to mark appointment as a "no show". Writer offered patient to reschedule for an appointment next week or to walk in to [facility] as he stated that he was in crisis and was "ready to end it all because I tired of it all". Patient stated he had no mode of transportation. Writer stayed connected with patient as colleague contacted mobile crisis unit. Mobile crisis unit stated that there would be a two hour wait until someone could reach patient location. Writer then informed colleague to hang up and call 911. Officers were immediately sent to patients location, as Clinical research associate stayed on the phone until officers were heard arriving. Patient begin speaking after officers arrived and let us know he was safe.

## 2021-01-16 NOTE — ED Notes (Signed)
Pt A&O x 4, resting atpresent, no distress noted, calm & cooperative.  Remains SsI, monitoring for safety.

## 2021-01-16 NOTE — ED Notes (Signed)
Patient is awake, quite and relaxing

## 2021-01-16 NOTE — ED Triage Notes (Signed)
34 yo male bought in by [employer] with complaints stating, "I am having suicidal thoughts and I can't shake that feeling. I've had so many problems in my life that I don't know where to begin.  My grandmother died last year and I relapsed on crystal meth on New Years. I've not used for about 3 weeks now but I feel manicy". Pt endorse SI no plan, denies HI/AVH.
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
## 2021-01-17 ENCOUNTER — Ambulatory Visit (INDEPENDENT_AMBULATORY_CARE_PROVIDER_SITE_OTHER): Payer: No Payment, Other | Admitting: Licensed Clinical Social Worker

## 2021-01-17 ENCOUNTER — Other Ambulatory Visit: Payer: Self-pay | Admitting: Psychiatry

## 2021-01-17 ENCOUNTER — Encounter (HOSPITAL_COMMUNITY): Payer: Self-pay | Admitting: Licensed Clinical Social Worker

## 2021-01-17 DIAGNOSIS — F3162 Bipolar disorder, current episode mixed, moderate: Secondary | ICD-10-CM | POA: Diagnosis not present

## 2021-01-17 DIAGNOSIS — F1994 Other psychoactive substance use, unspecified with psychoactive substance-induced mood disorder: Secondary | ICD-10-CM | POA: Diagnosis not present

## 2021-01-17 MED ORDER — TRAZODONE HCL 50 MG PO TABS: 50.0000 mg | ORAL_TABLET | Freq: Every evening | ORAL | 0 refills | Status: DC | PRN

## 2021-01-17 MED ORDER — SERTRALINE HCL 50 MG PO TABS: 50.0000 mg | ORAL_TABLET | Freq: Every day | ORAL | 0 refills | Status: DC
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
## 2021-01-17 NOTE — ED Notes (Signed)
Breakfast given: grits

## 2021-01-17 NOTE — Discharge Instructions (Addendum)
Patient is instructed prior to discharge to: Take all medications as prescribed by his/her mental healthcare provider. Report any adverse effects and or reactions from the medicines to his/her outpatient provider promptly. Patient has been instructed & cautioned: To not engage in alcohol and or illegal drug use while on prescription medicines. In the event of worsening symptoms, patient is instructed to call the crisis hotline, 911 and or go to the nearest ED for appropriate evaluation and treatment of symptoms. To follow-up with his/her primary care provider for your other medical issues, concerns and or health care needs.   May also use [place] pharmacy for prescriptions

## 2021-01-17 NOTE — ED Notes (Signed)
Pt sleeping at present, no distress noted, monitoring for safety. 

## 2021-01-18 LAB — PROLACTIN: Prolactin: 11.8 ng/mL (ref 4.0–15.2)

## 2021-01-22 MED FILL — SERTRALINE HCL 50 MG TABLET: 50 | 30 days supply | Qty: 30 | Fill #0

## 2021-01-22 MED FILL — traZODone HCL 50 MG TABS: 50 | 30 days supply | Qty: 30 | Fill #0

## 2021-01-27 ENCOUNTER — Ambulatory Visit (INDEPENDENT_AMBULATORY_CARE_PROVIDER_SITE_OTHER): Payer: No Payment, Other | Admitting: Behavioral Health

## 2021-01-27 ENCOUNTER — Other Ambulatory Visit: Payer: Self-pay

## 2021-01-27 DIAGNOSIS — F1520 Other stimulant dependence, uncomplicated: Secondary | ICD-10-CM

## 2021-01-28 NOTE — Progress Notes (Signed)
   THERAPIST PROGRESS NOTE  Session Time: 2:00PM  Participation Level: Active  Behavioral Response: Well GroomedAlertPleasant  Type of Therapy: SAIOP Intake/Individual Therapy  Treatment Goals addressed: Diagnosis: Substance Use  Interventions: Motivational Interviewing  Summary: Edward Wolfe is a 35 y.o. male who [facility] to [facility] for a scheduled intake for SAIOP. [name] was recommended for SAIOP to address his substance use disorder. [name] presents alert and oriented x4; however, clt appeared to be depressed in his mood. [name] reported to this writer that he recently relapsed using meth on 01/24/2021. He reports using "a couple of bowls" with his boyfriend. [name] denied having any current withdrawal symptoms. He did not appear to be at immediate risk for withdrawal and elected to move forward with enrollment into SAIOP. [name] shared that he is only triggered to use when he is around his boyfriend. [name] states his boyfriend has not agreed to stop using substances nor has he agreed to seek treatment for his substance use. [name] shared that this puts him in a "tough space" because he wants to continue a relationship with his partner but understands that his relationship will continue to impede his progress towards recovery. [name] appeared to be somewhat tearful as he thought about needing to make the decision of staying with his boyfriend or seeking recovery for himself. He also talked about the things his addiction has damaged in his life such as his relationships with his family and his overall health and appearance. [name] is also in the process of seeking full time employment. He shared that he recently had an interview with [employer].   Suicidal/Homicidal: No  Therapist Response: Therapist offered encouragement and support. Therapist discussed the goal of SAIOP and group rules. Therapist discussed treatment goals and developed a comprehensive crisis plan.  Plan: Return to SAIOP - M, W, F - 9am  - 12pm.  Diagnosis: Axis I: Substance Abuse    Axis II: No diagnosis    Mamie Nick, Counselor 01/28/2021

## 2021-01-29 ENCOUNTER — Other Ambulatory Visit (HOSPITAL_COMMUNITY): Payer: Self-pay | Admitting: Medical

## 2021-01-29 ENCOUNTER — Other Ambulatory Visit: Payer: Self-pay

## 2021-01-29 ENCOUNTER — Ambulatory Visit (INDEPENDENT_AMBULATORY_CARE_PROVIDER_SITE_OTHER): Payer: No Payment, Other | Admitting: Behavioral Health

## 2021-01-29 DIAGNOSIS — K009 Disorder of tooth development, unspecified: Secondary | ICD-10-CM

## 2021-01-29 DIAGNOSIS — F1520 Other stimulant dependence, uncomplicated: Secondary | ICD-10-CM

## 2021-01-29 DIAGNOSIS — F1924 Other psychoactive substance dependence with psychoactive substance-induced mood disorder: Secondary | ICD-10-CM | POA: Diagnosis not present

## 2021-01-29 DIAGNOSIS — K029 Dental caries, unspecified: Secondary | ICD-10-CM

## 2021-01-29 DIAGNOSIS — F19982 Other psychoactive substance use, unspecified with psychoactive substance-induced sleep disorder: Secondary | ICD-10-CM | POA: Diagnosis not present

## 2021-01-29 DIAGNOSIS — T7422XS Child sexual abuse, confirmed, sequela: Secondary | ICD-10-CM

## 2021-01-29 DIAGNOSIS — F4312 Post-traumatic stress disorder, chronic: Secondary | ICD-10-CM | POA: Diagnosis not present

## 2021-01-29 DIAGNOSIS — Z639 Problem related to primary support group, unspecified: Secondary | ICD-10-CM

## 2021-01-29 MED ORDER — SERTRALINE HCL 100 MG PO TABS
100.0000 mg | ORAL_TABLET | Freq: Every day | ORAL | 2 refills | Status: DC
Start: 2021-01-29 — End: 2021-01-29

## 2021-01-29 MED ORDER — TRAZODONE HCL 100 MG PO TABS
100.0000 mg | ORAL_TABLET | Freq: Every evening | ORAL | 2 refills | Status: DC | PRN
Start: 2021-01-29 — End: 2021-01-29
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
## 2021-01-31 ENCOUNTER — Other Ambulatory Visit (HOSPITAL_COMMUNITY): Payer: Self-pay | Admitting: Medical

## 2021-01-31 ENCOUNTER — Other Ambulatory Visit: Payer: Self-pay

## 2021-01-31 ENCOUNTER — Ambulatory Visit (HOSPITAL_COMMUNITY): Payer: No Payment, Other | Admitting: Behavioral Health

## 2021-01-31 DIAGNOSIS — F1520 Other stimulant dependence, uncomplicated: Secondary | ICD-10-CM

## 2021-01-31 MED ORDER — BACLOFEN 10 MG PO TABS: 10.0000 mg | ORAL_TABLET | Freq: Three times a day (TID) | ORAL | 1 refills | Status: DC

## 2021-01-31 MED FILL — BACLOFEN 10 MG TABLET: 10 | 30 days supply | Qty: 90 | Fill #0

## 2021-01-31 NOTE — Progress Notes (Signed)
Brief encounter to discuss MAT for Meth and Disease/Brain disorder of addiction ie Dopamine/instinctual brain (rat brain) receptor damage and triggers.  Recommend MAT with Baclofen as it originally was discovered to be effective with crack cocaine triggers.He accepts and rx sent today FU Monday

## 2021-02-03 ENCOUNTER — Encounter (HOSPITAL_COMMUNITY): Payer: Self-pay | Admitting: Behavioral Health

## 2021-02-03 ENCOUNTER — Ambulatory Visit (HOSPITAL_COMMUNITY): Payer: Self-pay | Admitting: Medical

## 2021-02-03 NOTE — Group Note (Signed)
Group Topic: Healthy Relationships  Group Date: 01/31/2021 Start Time:  9:00 AM End Time: 10:00 AM Facilitators: Mamie Nick, Counselor  Department: [facility]  Number of Participants: 1  Group Focus: acceptance, anxiety, coping skills, dual diagnosis, feeling awareness/expression, healthy friendships, relapse prevention, and self-esteem Treatment Modality:  Cognitive Behavioral Therapy Interventions utilized were clarification, confrontation, and exploration Purpose: enhance coping skills, explore maladaptive thinking, express feelings, increase insight, regain self-worth, reinforce self-care, and relapse prevention strategies   Name: Edward Wolfe Date of Birth: [birth year]  MR: [id number]    Level of Participation: active Quality of Participation: attentive and cooperative Interactions with others: gave feedback Mood/Affect: appropriate Triggers (if applicable): N/A Cognition: coherent/clear and insightful Progress: Minimal Response: [name] checked-in by sharing he is doing well. He reports he is still battling with his decision to end things with his boyfriend. He reports knowing that this will be the best thing for him and his goal of recovery. He reports he is triggered to use meth whenever he goes around his boyfriend. [name] identified his triggers and ways he can exist in a world full of triggers. [name] shared that he has a goal of making amends with his mother and sister. He engaged well in the session today. Plan: follow-up needed  Patients Problems:  Patient Active Problem List   Diagnosis Date Noted  . Bipolar 1 disorder, mixed, moderate (HCC) 01/17/2021  . Substance induced mood disorder (HCC) 01/17/2021  . Amphetamine and psychostimulant-induced psychosis with delusions (HCC)   . Bipolar I disorder, most recent episode depressed [facility])      Family Program: Family present? No   Name of family member(s): N/A  UDS collected: No Results:  N/A  AA/NA attended?: No  Sponsor?: NA

## 2021-02-03 NOTE — Progress Notes (Signed)
Pt called in sick.Will reschedule to see Weds

## 2021-02-05 ENCOUNTER — Other Ambulatory Visit: Payer: Self-pay

## 2021-02-05 ENCOUNTER — Ambulatory Visit (INDEPENDENT_AMBULATORY_CARE_PROVIDER_SITE_OTHER): Payer: No Payment, Other | Admitting: Behavioral Health

## 2021-02-05 ENCOUNTER — Encounter (HOSPITAL_COMMUNITY): Payer: Self-pay | Admitting: Medical

## 2021-02-05 DIAGNOSIS — F19982 Other psychoactive substance use, unspecified with psychoactive substance-induced sleep disorder: Secondary | ICD-10-CM

## 2021-02-05 DIAGNOSIS — F1520 Other stimulant dependence, uncomplicated: Secondary | ICD-10-CM

## 2021-02-05 DIAGNOSIS — F4312 Post-traumatic stress disorder, chronic: Secondary | ICD-10-CM

## 2021-02-05 DIAGNOSIS — F341 Dysthymic disorder: Secondary | ICD-10-CM

## 2021-02-05 DIAGNOSIS — F1994 Other psychoactive substance use, unspecified with psychoactive substance-induced mood disorder: Secondary | ICD-10-CM

## 2021-02-05 DIAGNOSIS — T7422XS Child sexual abuse, confirmed, sequela: Secondary | ICD-10-CM

## 2021-02-05 DIAGNOSIS — F1924 Other psychoactive substance dependence with psychoactive substance-induced mood disorder: Secondary | ICD-10-CM

## 2021-02-05 NOTE — Progress Notes (Signed)
FU to complete Orientation to Disease Concept and Initial evaluation completed

## 2021-02-07 ENCOUNTER — Ambulatory Visit (INDEPENDENT_AMBULATORY_CARE_PROVIDER_SITE_OTHER): Payer: No Payment, Other | Admitting: Behavioral Health

## 2021-02-07 ENCOUNTER — Ambulatory Visit (HOSPITAL_COMMUNITY): Payer: Self-pay

## 2021-02-07 ENCOUNTER — Other Ambulatory Visit: Payer: Self-pay

## 2021-02-07 DIAGNOSIS — F1520 Other stimulant dependence, uncomplicated: Secondary | ICD-10-CM | POA: Diagnosis not present

## 2021-02-10 ENCOUNTER — Ambulatory Visit (HOSPITAL_COMMUNITY): Payer: Self-pay

## 2021-02-10 ENCOUNTER — Ambulatory Visit (INDEPENDENT_AMBULATORY_CARE_PROVIDER_SITE_OTHER): Payer: No Payment, Other | Admitting: Behavioral Health

## 2021-02-10 ENCOUNTER — Encounter (HOSPITAL_COMMUNITY): Payer: Self-pay | Admitting: Behavioral Health

## 2021-02-10 ENCOUNTER — Other Ambulatory Visit: Payer: Self-pay

## 2021-02-10 DIAGNOSIS — F1520 Other stimulant dependence, uncomplicated: Secondary | ICD-10-CM | POA: Diagnosis not present

## 2021-02-10 MED FILL — SERTRALINE HCL 100 MG TAB: 100 | 30 days supply | Qty: 30 | Fill #0

## 2021-02-10 MED FILL — TRAZODONE HCL 100 MG TABS: 100 | 30 days supply | Qty: 30 | Fill #0
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
## 2021-02-10 NOTE — Group Note (Signed)
Group Topic: Boundaries  Group Date: 02/07/2021 Start Time:  9:00 AM End Time: 12:00 PM Facilitators: Mamie Nick, Counselor  Department: [facility]  Number of Participants: 2  Group Focus: acceptance, anxiety, co-dependency, coping skills, depression, dual diagnosis, family, feeling awareness/expression, forgiveness, loss/grief issues, and relapse prevention Treatment Modality:  Cognitive Behavioral Therapy Interventions utilized were clarification, confrontation, exploration, and group exercise Purpose: enhance coping skills, explore maladaptive thinking, express feelings, express irrational fears, increase insight, regain self-worth, relapse prevention strategies, and trigger / craving management   Name: Edward Wolfe Date of Birth: 11/11/1986  MR: [id number]    Level of Participation: active Quality of Participation: attentive and cooperative Interactions with others: gave feedback Mood/Affect: appropriate Triggers (if applicable): N/A Cognition: coherent/clear Progress: Minimal Response: [name] checked in by sharing that he did not get a good night's rest last night. He presented to group with his work clothes on from the previous night. He rated his depression and anxiety both a ZERO. He reports he got into and argument with his ex-boyfriend and this triggered an urge to use. He self-reported that he "hit it once" (referring to smoking meth). He was asked to process this relapse. He provided minimal response to this question. [name] was very restless during group as he was very fidgety and appeared to have difficulty sitting still. Another group members asked clt if he was having currently thoughts of using and clt reported "yes". [name] processed these thoughts and feelings openly in amongst his peers. [name] also disclosed that he has an 18yo son that he has not seen in 18 years since the death of his son's mother. [name] became tearful as he shared this information. He  was receptive to the support offered by his peers. Plan: follow-up needed  Patients Problems:  Patient Active Problem List   Diagnosis Date Noted  . Bipolar 1 disorder, mixed, moderate (HCC) 01/17/2021  . Substance induced mood disorder (HCC) 01/17/2021  . Amphetamine and psychostimulant-induced psychosis with delusions (HCC)   . Bipolar I disorder, most recent episode depressed [facility])      Family Program: Family present? No   Name of family member(s): N/A  UDS collected: No Results: N/A  AA/NA attended?: No  Sponsor?: No

## 2021-02-11 NOTE — Group Note (Addendum)
Group Topic: Feelings and Emotions  Group Date: 02/05/2021 Start Time:  9:00 AM End Time: 12:00 PM Facilitators: Mamie Nick, Counselor  Department: [facility]  Number of Participants: 3  Group Focus: acceptance, anger management, anxiety, co-dependency, coping skills, dual diagnosis, family, relapse prevention, and self-awareness Treatment Modality:  Cognitive Behavioral Therapy Interventions utilized were clarification, confrontation, exploration, and group exercise Purpose: enhance coping skills, explore maladaptive thinking, express feelings, increase insight, relapse prevention strategies, and trigger / craving management   Name: Edward Wolfe Date of Birth: 1986/05/24  MR: [id number]    Level of Participation: active Quality of Participation: attentive and cooperative Interactions with others: gave feedback Mood/Affect: appropriate Triggers (if applicable): N/A Cognition: coherent/clear Progress: Gaining insight Response: [name] checked-in by sharing that he decided to end his relationship with his boyfriend after a heated argument. He reports he felt extremely disrespected when his boyfriend became verbally aggressive. [name] rated his depression and anxiety at a 10 on a scale 0 to 10, with 10 being the worse. [name] shared that his goal for today is to "work on getting through separation". [name] was receptive to support and feedback offered to him by his peers. Continue to monitor.   Plan: follow-up needed  Patients Problems:  Patient Active Problem List   Diagnosis Date Noted  . Bipolar 1 disorder, mixed, moderate (HCC) 01/17/2021  . Substance induced mood disorder (HCC) 01/17/2021  . Amphetamine and psychostimulant-induced psychosis with delusions (HCC)   . Bipolar I disorder, most recent episode depressed [facility])      Family Program: Family present? No   Name of family member(s): N/A  UDS collected: No Results: N/A  AA/NA attended?: No  Sponsor?:  No

## 2021-02-12 ENCOUNTER — Ambulatory Visit (INDEPENDENT_AMBULATORY_CARE_PROVIDER_SITE_OTHER): Payer: No Payment, Other | Admitting: Behavioral Health

## 2021-02-12 ENCOUNTER — Other Ambulatory Visit: Payer: Self-pay

## 2021-02-12 ENCOUNTER — Ambulatory Visit (HOSPITAL_COMMUNITY): Payer: Self-pay

## 2021-02-12 DIAGNOSIS — F1520 Other stimulant dependence, uncomplicated: Secondary | ICD-10-CM
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
## 2021-02-14 ENCOUNTER — Ambulatory Visit (INDEPENDENT_AMBULATORY_CARE_PROVIDER_SITE_OTHER): Payer: No Payment, Other | Admitting: Behavioral Health

## 2021-02-14 ENCOUNTER — Other Ambulatory Visit: Payer: Self-pay

## 2021-02-14 ENCOUNTER — Ambulatory Visit (HOSPITAL_COMMUNITY): Payer: Self-pay

## 2021-02-14 DIAGNOSIS — F15950 Other stimulant use, unspecified with stimulant-induced psychotic disorder with delusions: Secondary | ICD-10-CM

## 2021-02-14 DIAGNOSIS — F3162 Bipolar disorder, current episode mixed, moderate: Secondary | ICD-10-CM

## 2021-02-14 NOTE — Group Note (Signed)
Group Topic: General Coping Skills  Group Date: 02/14/2021 Start Time:  9:00 AM End Time: 10:30 AM Facilitators: Mamie Nick, Counselor  Department: [facility]  Number of Participants: 1  Group Focus: acceptance, affirmation, anxiety, and coping skills Treatment Modality:  Cognitive Behavioral Therapy and Individual Therapy Interventions utilized were clarification, confrontation, and exploration Purpose: enhance coping skills, express feelings, increase insight, relapse prevention strategies, and trigger / craving management   Name: Edward Wolfe Date of Birth: 17-Oct-1986  MR: [id number]    Level of Participation: active Quality of Participation: attentive and cooperative Interactions with others: gave feedback Mood/Affect: appropriate Triggers (if applicable): N/A Cognition: coherent/clear and logical Progress: Minimal Response: [name] checked-in by sharing that he had a stressful night at work. He reports having a conversation with his Art therapist regarding respect and boundaries. [name] also talked about his chaotic living environment with his roommate. He shared that his roommate does not clean up and this agitates the client. He feels like he needs to make a fast decision on whether or not he needs to find him an apartment or purchase a car when he gets his tax refund check. [name] was insightful as he processed the best financial decision to make for himself. [name] shared that he has been witting in his journal as a tool to help him when he is feeling increased stress and anxiety.  Plan: follow-up needed  Patients Problems:  Patient Active Problem List   Diagnosis Date Noted  . Bipolar 1 disorder, mixed, moderate (HCC) 01/17/2021  . Substance induced mood disorder (HCC) 01/17/2021  . Amphetamine and psychostimulant-induced psychosis with delusions (HCC)   . Bipolar I disorder, most recent episode depressed [facility])      Family Program: Family present? No    Name of family member(s): N/A   UDS collected: No Results: N/A  AA/NA attended?: No  Sponsor?: No

## 2021-02-17 ENCOUNTER — Ambulatory Visit (HOSPITAL_COMMUNITY): Payer: Self-pay

## 2021-02-17 ENCOUNTER — Ambulatory Visit (HOSPITAL_COMMUNITY): Payer: Self-pay | Admitting: Behavioral Health

## 2021-02-19 ENCOUNTER — Ambulatory Visit (HOSPITAL_COMMUNITY): Payer: No Payment, Other | Admitting: Behavioral Health

## 2021-02-21 ENCOUNTER — Telehealth (HOSPITAL_COMMUNITY): Payer: Self-pay | Admitting: Behavioral Health

## 2021-02-21 ENCOUNTER — Ambulatory Visit (HOSPITAL_COMMUNITY): Payer: No Payment, Other | Admitting: Behavioral Health

## 2021-02-21 NOTE — Telephone Encounter (Signed)
Patient contacted office to speak with [facility] [place], Patient stated that the he recently moved & got into an argument with is roommate. This led to the patient relapsing. Patient stated that he was safe & okay. He was advised to go to the hospital and/or call 911 if he felt that he was a harm to himself or anyone else. He also stated that he'd be in for group on Monday.

## 2021-02-24 ENCOUNTER — Ambulatory Visit (INDEPENDENT_AMBULATORY_CARE_PROVIDER_SITE_OTHER): Payer: No Payment, Other | Admitting: Behavioral Health

## 2021-02-24 ENCOUNTER — Other Ambulatory Visit: Payer: Self-pay

## 2021-02-24 DIAGNOSIS — F1520 Other stimulant dependence, uncomplicated: Secondary | ICD-10-CM | POA: Diagnosis not present

## 2021-02-24 NOTE — Telephone Encounter (Signed)
Thanks seen at [facility] Monday

## 2021-02-24 NOTE — Telephone Encounter (Signed)
2
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
## 2021-02-26 ENCOUNTER — Ambulatory Visit (HOSPITAL_COMMUNITY): Payer: No Payment, Other | Admitting: Behavioral Health

## 2021-02-28 ENCOUNTER — Ambulatory Visit (HOSPITAL_COMMUNITY): Payer: No Payment, Other | Admitting: Behavioral Health

## 2021-03-03 ENCOUNTER — Encounter (HOSPITAL_COMMUNITY): Payer: No Payment, Other | Admitting: Psychiatry

## 2021-03-03 ENCOUNTER — Ambulatory Visit (HOSPITAL_COMMUNITY): Payer: No Payment, Other | Admitting: Behavioral Health

## 2021-03-05 ENCOUNTER — Ambulatory Visit (HOSPITAL_COMMUNITY): Payer: No Payment, Other | Admitting: Behavioral Health

## 2021-03-07 ENCOUNTER — Ambulatory Visit (HOSPITAL_COMMUNITY): Payer: No Payment, Other | Admitting: Behavioral Health

## 2021-03-10 ENCOUNTER — Ambulatory Visit (HOSPITAL_COMMUNITY): Payer: No Payment, Other | Admitting: Behavioral Health

## 2021-03-12 ENCOUNTER — Ambulatory Visit (HOSPITAL_COMMUNITY): Payer: No Payment, Other

## 2021-03-14 ENCOUNTER — Ambulatory Visit (HOSPITAL_COMMUNITY): Payer: No Payment, Other

## 2021-03-17 ENCOUNTER — Ambulatory Visit (HOSPITAL_COMMUNITY): Payer: No Payment, Other

## 2021-03-19 ENCOUNTER — Ambulatory Visit (HOSPITAL_COMMUNITY): Payer: No Payment, Other

## 2021-03-21 ENCOUNTER — Ambulatory Visit (HOSPITAL_COMMUNITY): Payer: No Payment, Other

## 2021-04-07 ENCOUNTER — Telehealth (HOSPITAL_COMMUNITY): Payer: Self-pay | Admitting: Behavioral Health

## 2021-04-07 MED FILL — Sertraline HCl Tab 100 MG: ORAL | 30 days supply | Qty: 30 | Fill #0 | Status: AC

## 2021-04-07 MED FILL — Trazodone HCl Tab 100 MG: ORAL | 30 days supply | Qty: 30 | Fill #0 | Status: AC

## 2021-04-07 MED FILL — Baclofen Tab 10 MG: ORAL | 30 days supply | Qty: 90 | Fill #0 | Status: AC

## 2021-04-07 NOTE — Telephone Encounter (Signed)
Pt called requesting provider, S Spease put in a referral to [facility] for tomorrow, 04/08/2021, for pt to go to rehab.

## 2021-04-08 ENCOUNTER — Other Ambulatory Visit: Payer: Self-pay

## 2021-04-09 ENCOUNTER — Other Ambulatory Visit: Payer: Self-pay

## 2021-04-12 ENCOUNTER — Other Ambulatory Visit: Payer: Self-pay

## 2021-04-12 ENCOUNTER — Ambulatory Visit
Admission: EM | Admit: 2021-04-12 | Discharge: 2021-04-12 | Disposition: A | Discharge status: Home / Self Care | Payer: Self-pay | Attending: Family Medicine | Admitting: Family Medicine

## 2021-04-12 ENCOUNTER — Encounter: Payer: Self-pay | Admitting: Emergency Medicine

## 2021-04-12 DIAGNOSIS — N492 Inflammatory disorders of scrotum: Secondary | ICD-10-CM

## 2021-04-12 MED ORDER — DOXYCYCLINE HYCLATE 100 MG PO CAPS
100.0000 mg | ORAL_CAPSULE | Freq: Two times a day (BID) | ORAL | 0 refills | Status: DC
Start: 2021-04-12 — End: 2021-11-04

## 2021-04-12 MED ORDER — CEFTRIAXONE SODIUM 1 G IJ SOLR
1.0000 g | Freq: Once | INTRAMUSCULAR | Status: AC
  Administered 2021-04-12: 1 g via INTRAMUSCULAR

## 2021-04-12 NOTE — ED Triage Notes (Signed)
Patient c/o testicular pain since yesterday.   Patient endorses "burning pain" in affected area.   Patient denies any dysuria, foul urine odor, ABD pain and hematuria.   Patient endorses pain on both side of testicle.  Patient denies any lesions or skin changes.   Patient endorses relapsing from meth use.

## 2021-04-12 NOTE — Discharge Instructions (Addendum)
Meds ordered this encounter  Medications   cefTRIAXone (ROCEPHIN) injection 1 g   doxycycline (VIBRAMYCIN) 100 MG capsule    Sig: Take 1 capsule (100 mg total) by mouth 2 (two) times daily.    Dispense:  20 capsule    Refill:  0    
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
## 2021-05-20 ENCOUNTER — Other Ambulatory Visit (HOSPITAL_COMMUNITY): Payer: Self-pay | Admitting: Medical

## 2021-05-20 ENCOUNTER — Other Ambulatory Visit: Payer: Self-pay

## 2021-05-20 MED FILL — Sertraline HCl Tab 100 MG: ORAL | 30 days supply | Qty: 30 | Fill #1 | Status: AC

## 2021-05-20 MED FILL — Trazodone HCl Tab 100 MG: ORAL | 30 days supply | Qty: 30 | Fill #1 | Status: AC

## 2021-05-21 ENCOUNTER — Other Ambulatory Visit: Payer: Self-pay

## 2021-05-22 ENCOUNTER — Other Ambulatory Visit: Payer: Self-pay

## 2021-05-23 ENCOUNTER — Other Ambulatory Visit: Payer: Self-pay

## 2021-05-26 ENCOUNTER — Other Ambulatory Visit: Payer: Self-pay

## 2021-05-29 ENCOUNTER — Telehealth (HOSPITAL_COMMUNITY): Payer: Self-pay | Admitting: Behavioral Health

## 2021-05-29 NOTE — Telephone Encounter (Signed)
Pt requesting to rejoin group therapy. Please call pt after next week. Pt [phone].

## 2021-06-18 ENCOUNTER — Other Ambulatory Visit: Payer: Self-pay

## 2021-06-18 ENCOUNTER — Emergency Department (HOSPITAL_COMMUNITY)
Admission: EM | Admit: 2021-06-18 | Discharge: 2021-06-18 | Disposition: A | Discharge status: Home / Self Care | Payer: Self-pay | Attending: Emergency Medicine | Admitting: Emergency Medicine

## 2021-06-18 ENCOUNTER — Emergency Department (HOSPITAL_COMMUNITY): Payer: Self-pay

## 2021-06-18 ENCOUNTER — Encounter (HOSPITAL_COMMUNITY): Payer: Self-pay

## 2021-06-18 DIAGNOSIS — R531 Weakness: Secondary | ICD-10-CM | POA: Insufficient documentation

## 2021-06-18 DIAGNOSIS — Z5321 Procedure and treatment not carried out due to patient leaving prior to being seen by health care provider: Secondary | ICD-10-CM | POA: Insufficient documentation

## 2021-06-18 DIAGNOSIS — R202 Paresthesia of skin: Secondary | ICD-10-CM | POA: Insufficient documentation

## 2021-06-18 DIAGNOSIS — R42 Dizziness and giddiness: Secondary | ICD-10-CM | POA: Insufficient documentation

## 2021-06-18 LAB — BASIC METABOLIC PANEL
Anion gap: 7 (ref 5–15)
BUN: 9 mg/dL (ref 6–20)
CO2: 30 mmol/L (ref 22–32)
Calcium: 9.4 mg/dL (ref 8.9–10.3)
Chloride: 103 mmol/L (ref 98–111)
Creatinine, Ser: 0.84 mg/dL (ref 0.61–1.24)
GFR, Estimated: >60 mL/min (ref >60)
Glucose, Bld: 89 mg/dL (ref 70–99)
Potassium: 4 mmol/L (ref 3.5–5.1)
Sodium: 140 mmol/L (ref 135–145)

## 2021-06-18 LAB — CBC
HCT: 47.2 % (ref 39.0–52.0)
Hemoglobin: 15.2 g/dL (ref 13.0–17.0)
MCH: 28.2 pg (ref 26.0–34.0)
MCHC: 32.2 g/dL (ref 30.0–36.0)
MCV: 87.6 fL (ref 80.0–100.0)
Platelets: 205 K/uL (ref 150–400)
RBC: 5.39 MIL/uL (ref 4.22–5.81)
RDW: 14.3 % (ref 11.5–15.5)
WBC: 7.8 K/uL (ref 4.0–10.5)
nRBC: 0 % (ref 0.0–0.2)

## 2021-06-18 LAB — URINALYSIS, ROUTINE W REFLEX MICROSCOPIC
Bilirubin Urine: NEGATIVE
Glucose, UA: NEGATIVE mg/dL
Hgb urine dipstick: NEGATIVE
Ketones, ur: NEGATIVE mg/dL
Leukocytes,Ua: NEGATIVE
Nitrite: NEGATIVE
Protein, ur: NEGATIVE mg/dL
Specific Gravity, Urine: 1.008 (ref 1.005–1.030)
pH: 5 (ref 5.0–8.0)

## 2021-06-18 LAB — CBG MONITORING, ED: Glucose-Capillary: 101 mg/dL — ABNORMAL HIGH (ref 70–99)
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
## 2021-06-19 ENCOUNTER — Other Ambulatory Visit (HOSPITAL_COMMUNITY): Payer: Self-pay | Admitting: Medical

## 2021-07-07 ENCOUNTER — Other Ambulatory Visit: Payer: Self-pay

## 2021-07-22 ENCOUNTER — Other Ambulatory Visit: Payer: Self-pay

## 2021-07-23 ENCOUNTER — Other Ambulatory Visit: Payer: Self-pay

## 2021-07-23 ENCOUNTER — Telehealth (INDEPENDENT_AMBULATORY_CARE_PROVIDER_SITE_OTHER): Payer: No Payment, Other | Admitting: Psychiatry

## 2021-07-23 ENCOUNTER — Encounter (HOSPITAL_COMMUNITY): Payer: Self-pay | Admitting: Psychiatry

## 2021-07-23 DIAGNOSIS — F3130 Bipolar disorder, current episode depressed, mild or moderate severity, unspecified: Secondary | ICD-10-CM | POA: Diagnosis not present

## 2021-07-23 MED ORDER — SERTRALINE HCL 100 MG PO TABS
ORAL_TABLET | Freq: Every day | ORAL | 2 refills | Status: DC
  Filled 2021-07-23: qty 30, 30d supply, fill #0

## 2021-07-23 MED ORDER — TRAZODONE HCL 100 MG PO TABS
ORAL_TABLET | Freq: Every evening | ORAL | 2 refills | Status: DC | PRN
  Filled 2021-07-23: qty 30, 30d supply, fill #0
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
## 2021-07-24 ENCOUNTER — Other Ambulatory Visit: Payer: Self-pay

## 2021-07-30 ENCOUNTER — Other Ambulatory Visit: Payer: Self-pay

## 2021-08-01 ENCOUNTER — Other Ambulatory Visit: Payer: Self-pay

## 2021-08-14 ENCOUNTER — Other Ambulatory Visit: Payer: Self-pay

## 2021-09-01 ENCOUNTER — Emergency Department (HOSPITAL_COMMUNITY)
Admission: EM | Admit: 2021-09-01 | Discharge: 2021-09-01 | Disposition: A | Discharge status: Home / Self Care | Payer: Self-pay | Attending: Emergency Medicine | Admitting: Emergency Medicine

## 2021-09-01 ENCOUNTER — Other Ambulatory Visit: Payer: Self-pay

## 2021-09-01 ENCOUNTER — Encounter (HOSPITAL_COMMUNITY): Payer: Self-pay

## 2021-09-01 ENCOUNTER — Emergency Department (HOSPITAL_COMMUNITY): Payer: Self-pay

## 2021-09-01 DIAGNOSIS — F17210 Nicotine dependence, cigarettes, uncomplicated: Secondary | ICD-10-CM | POA: Insufficient documentation

## 2021-09-01 DIAGNOSIS — N433 Hydrocele, unspecified: Secondary | ICD-10-CM | POA: Insufficient documentation

## 2021-09-01 LAB — URINALYSIS, ROUTINE W REFLEX MICROSCOPIC
Bilirubin Urine: NEGATIVE
Glucose, UA: NEGATIVE mg/dL
Ketones, ur: NEGATIVE mg/dL
Leukocytes,Ua: NEGATIVE
Nitrite: NEGATIVE
Protein, ur: NEGATIVE mg/dL
Specific Gravity, Urine: 1.02 (ref 1.005–1.030)
pH: 6.5 (ref 5.0–8.0)

## 2021-09-01 LAB — URINALYSIS, MICROSCOPIC (REFLEX)

## 2021-09-01 MED ORDER — NAPROXEN 500 MG PO TABS: 500.0000 mg | ORAL_TABLET | Freq: Two times a day (BID) | ORAL | 0 refills | Status: AC

## 2021-09-01 MED ORDER — NAPROXEN 500 MG PO TABS
500.0000 mg | ORAL_TABLET | Freq: Once | ORAL | Status: AC
  Administered 2021-09-01: 500 mg via ORAL
  Filled 2021-09-01: qty 1

## 2021-09-01 NOTE — Discharge Instructions (Addendum)
Your [place] today showed small fluid on your right testicle.  Your urinalysis did not show, urine culture was sent.  The number and chlamydia results will not be available for 48 hours.  I have provided a prescription for anti-inflammatories.  Please take 1 tablet twice a day for the next 7 days.

## 2021-09-01 NOTE — ED Provider Notes (Signed)
Emergency Medicine Provider Triage Evaluation Note  Edward Wolfe , a 35 y.o. male  was evaluated in triage.  Pt complains of R sided testicular pain.  Symptoms of been present for 2 weeks, worsening.  Associated dysuria.  Also reports urination is dribbling stream.  No hematuria.  No penile discharge.  No fevers or chills.  Now having mild abdominal pain as well  Review of Systems  Positive: Testicular pain Negative: fever  Physical Exam  There were no vitals taken for this visit. Gen:   Awake, no distress   Resp:  Normal effort  MSK:   Moves extremities without difficulty  Other:  Mild ttp of R lower abd.   Medical Decision Making  Medically screening exam initiated at 8:44 PM.  Appropriate orders placed.  Pao Zuckerman was informed that the remainder of the evaluation will be completed by another provider, this initial triage assessment does not replace that evaluation, and the importance of remaining in the ED until their evaluation is complete.  [place], UA   Makalyn Lennox, PA-C 09/01/21 2044    Pricilla Loveless, MD 09/01/21 [phone]

## 2021-09-01 NOTE — ED Triage Notes (Signed)
Pt BIB EMS Pt complains of right testicular pain x 2 weeks. Pt missed his appointment with the health department today.

## 2021-09-01 NOTE — ED Provider Notes (Signed)
[facility]-EMERGENCY DEPT Provider Note   CSN: [id number] Arrival date & time: 09/01/21  2038     History Chief Complaint  Patient presents with   Testicle Pain    Edward Wolfe is a 35 y.o. male.  35 y.o male with a PMH bipolar, amphetamine induced psychosis presents to the ED with a chief complaint of right testicular swelling for the past 2 weeks.  Reports the symptoms are waxing and waning, reported he noted his right testicle to be more indurated yesterday.  He has been applying ice to the right testicle, but has not noted any improvement in his symptoms.  No prior history of this.  He also endorses some lower abdominal pain, especially with palpation along the right groin.  Does have some urinary symptoms which include some dribbling midstream.  No penile discharge, no fever, no prior history of secondary infection.    The history is provided by the patient and medical records.  Testicle Pain This is a new problem.      History reviewed. No pertinent past medical history.  Patient Active Problem List   Diagnosis Date Noted   Bipolar 1 disorder, mixed, moderate (HCC) 01/17/2021   Substance induced mood disorder (HCC) 01/17/2021   Amphetamine and psychostimulant-induced psychosis with delusions (HCC)    Bipolar I disorder, most recent episode depressed (HCC)     History reviewed. No pertinent surgical history.     History reviewed. No pertinent family history.  Social History   Tobacco Use   Smoking status: Every Day    Packs/day: 1.00    Types: Cigarettes   Smokeless tobacco: Never  Substance Use Topics   Alcohol use: Yes   Drug use: Yes    Types: Marijuana, Methamphetamines    Comment: methamphatmines last use 3 weeks ago.     Home Medications Prior to Admission medications   Medication Sig Start Date End Date Taking? Authorizing Provider  naproxen (NAPROSYN) 500 MG tablet Take 1 tablet (500 mg total) by mouth 2 (two) times daily  for 7 days. 09/01/21 09/08/21 Yes Patrici Minnis, PA-C  baclofen (LIORESAL) 10 MG tablet TAKE 1 TABLET (10 MG TOTAL) BY MOUTH 3 (THREE) TIMES DAILY. 01/31/21 01/31/22  Court Joy, PA-C  doxycycline (VIBRAMYCIN) 100 MG capsule Take 1 capsule (100 mg total) by mouth 2 (two) times daily. 04/12/21   Mardella Layman, MD  sertraline (ZOLOFT) 100 MG tablet TAKE 1 TABLET (100 MG TOTAL) BY MOUTH DAILY. 07/23/21 07/23/22  Shanna Cisco, NP  traZODone (DESYREL) 100 MG tablet TAKE 1 TABLET (100 MG TOTAL) BY MOUTH AT BEDTIME AS NEEDED FOR SLEEP. 07/23/21 07/23/22  Toy Cookey E, NP    Allergies    Morphine and related  Review of Systems   Review of Systems  Constitutional:  Negative for chills and fever.  Genitourinary:  Positive for testicular pain. Negative for penile discharge.   Physical Exam Updated Vital Signs BP 133/77   Pulse (!) 52   Temp 99 F (37.2 C) (Oral)   Resp 18   SpO2 99%   Physical Exam Vitals and nursing note reviewed. Exam conducted with a chaperone present.  Constitutional:      Appearance: Normal appearance.  HENT:     Head: Normocephalic and atraumatic.     Mouth/Throat:     Mouth: Mucous membranes are moist.  Eyes:     Pupils: Pupils are equal, round, and reactive to light.  Cardiovascular:     Rate and Rhythm: Normal  rate.  Pulmonary:     Effort: Pulmonary effort is normal.  Abdominal:     General: Abdomen is flat.     Tenderness: There is no abdominal tenderness. There is no right CVA tenderness or left CVA tenderness.  Genitourinary:    Testes:        Right: Tenderness present. Swelling not present. Cremasteric reflex is present.         Left: Tenderness not present. Cremasteric reflex is present.      Epididymis:     Right: Inflamed. Tenderness present.     Comments: No visible rashes, lesions, pain along the right epididymis.  Pain with palpation of the groin.  No penile discharge noted.  Chaperoned by Samuella Bruin. Musculoskeletal:     Cervical back:  Normal range of motion and neck supple.  Skin:    General: Skin is warm and dry.  Neurological:     Mental Status: He is alert and oriented to person, place, and time.    ED Results / Procedures / Treatments   Labs (all labs ordered are listed, but only abnormal results are displayed) Labs Reviewed  URINALYSIS, ROUTINE W REFLEX MICROSCOPIC - Abnormal; Notable for the following components:      Result Value   Hgb urine dipstick TRACE (*)    All other components within normal limits  URINALYSIS, MICROSCOPIC (REFLEX) - Abnormal; Notable for the following components:   Bacteria, UA RARE (*)    All other components within normal limits  URINE CULTURE  GC/CHLAMYDIA PROBE AMP ([place]) NOT AT [facility]    EKG None  Radiology US SCROTUM W/DOPPLER  Result Date: 09/01/2021 CLINICAL DATA:  Right testicular pain for 2 weeks. EXAM: SCROTAL ULTRASOUND DOPPLER ULTRASOUND OF THE TESTICLES TECHNIQUE: Complete ultrasound examination of the testicles, epididymis, and other scrotal structures was performed. Color and spectral Doppler ultrasound were also utilized to evaluate blood flow to the testicles. COMPARISON:  None. FINDINGS: Right testicle Measurements: 3.9 x 2.5 x 2.8 cm. No mass or microlithiasis visualized. Left testicle Measurements: 4.0 x 2.5 x 2.5 cm. No mass or microlithiasis visualized. Right epididymis:  Enlarged and heterogeneous with hypervascularity. Left epididymis:  Normal in size and appearance. Hydrocele:  Small right hydrocele is present. Varicocele:  None visualized. Pulsed Doppler interrogation of both testes demonstrates normal low resistance arterial and venous waveforms bilaterally. IMPRESSION: 1. Findings compatible with right-sided epididymitis. 2. Small right hydrocele. Electronically Signed   By: Darliss Cheney M.D.   On: 09/01/2021 21:31    Procedures Procedures   Medications Ordered in ED Medications  naproxen (NAPROSYN) tablet 500 mg (500 mg Oral Given 09/01/21 2243)     ED Course  I have reviewed the triage vital signs and the nursing notes.  Pertinent labs & imaging results that were available during my care of the patient were reviewed by me and considered in my medical decision making (see chart for details).  Clinical Course as of 09/01/21 2300  Mon Sep 01, 2021  2254 Hgb urine dipstick(!): TRACE [JS]    Clinical Course User Index [JS] Claude Manges, PA-C   MDM Rules/Calculators/A&P    Patient presents to the ED with a chief complaint of right testicular swelling which began 2 weeks ago, has been applying ice without much improvement in his symptoms.  He also reports some urinary symptoms, with some kneeling upon emptying his bladder.  He is been applying ice to the area without much improvement in symptoms.  During evaluation there is no visible  lesions, no breaks, no penile discharge noted.  There is pain with palpation along the right epididymis, right testicle does appear swollen.  US scrotum: IMPRESSION:  1. Findings compatible with right-sided epididymitis.  2. Small right hydrocele.      Urinalysis showed a trace of hemoglobin, rare bacteria, this was sent for culture.  GC and chlamydia were also obtained, however he is informed of these results will not return for the next couple days.   Portions of this note were generated with Scientist, clinical (histocompatibility and immunogenetics). Dictation errors may occur despite best attempts at proofreading.  Final Clinical Impression(s) / ED Diagnoses Final diagnoses:  Right hydrocele    Rx / DC Orders ED Discharge Orders          Ordered    naproxen (NAPROSYN) 500 MG tablet  2 times daily        09/01/21 2216             Claude Manges, PA-C 09/01/21 2300    Cheryll Cockayne, MD 09/06/21 1744

## 2021-09-02 LAB — GC/CHLAMYDIA PROBE AMP (~~LOC~~) NOT AT ARMC
Chlamydia: NEGATIVE
Comment: NEGATIVE
Comment: NORMAL
Neisseria Gonorrhea: NEGATIVE

## 2021-09-03 LAB — URINE CULTURE: Culture: <10000 — AB

## 2021-09-08 ENCOUNTER — Other Ambulatory Visit: Payer: Self-pay

## 2021-09-09 ENCOUNTER — Other Ambulatory Visit: Payer: Self-pay

## 2021-10-07 ENCOUNTER — Telehealth: Payer: Self-pay

## 2021-10-07 ENCOUNTER — Encounter: Payer: Self-pay | Admitting: Pharmacist

## 2021-10-07 ENCOUNTER — Other Ambulatory Visit (HOSPITAL_COMMUNITY): Payer: Self-pay

## 2021-10-07 NOTE — Telephone Encounter (Signed)
[facility] Patient Advocate Encounter ? ?Insurance verification completed.   ? ?The patient is uninsured and will need patient assistance for medication. ? ?We can complete the application and will need to meet with the patient for signatures and income documentation. ? ?Devanta Daniel, CPhT ?Specialty Pharmacy Patient Advocate ?[facility] ?Phone: [phone] ?Fax:  [phone]  ?

## 2021-10-10 ENCOUNTER — Encounter: Payer: Self-pay | Admitting: Pharmacist

## 2021-10-23 ENCOUNTER — Telehealth (INDEPENDENT_AMBULATORY_CARE_PROVIDER_SITE_OTHER): Payer: No Payment, Other | Admitting: Psychiatry

## 2021-10-23 ENCOUNTER — Encounter (HOSPITAL_COMMUNITY): Payer: Self-pay | Admitting: Psychiatry

## 2021-10-23 ENCOUNTER — Other Ambulatory Visit: Payer: Self-pay

## 2021-10-23 DIAGNOSIS — F3130 Bipolar disorder, current episode depressed, mild or moderate severity, unspecified: Secondary | ICD-10-CM | POA: Diagnosis not present

## 2021-10-23 MED ORDER — TRAZODONE HCL 100 MG PO TABS
ORAL_TABLET | Freq: Every evening | ORAL | 3 refills | Status: DC | PRN
  Filled 2021-10-23: qty 30, 30d supply, fill #0

## 2021-10-23 MED ORDER — ARIPIPRAZOLE 5 MG PO TABS
5.0000 mg | ORAL_TABLET | Freq: Every day | ORAL | 3 refills | Status: DC
  Filled 2021-10-23: qty 30, 30d supply, fill #0

## 2021-10-23 MED ORDER — SERTRALINE HCL 100 MG PO TABS
ORAL_TABLET | Freq: Every day | ORAL | 3 refills | Status: DC
  Filled 2021-10-23: qty 30, 30d supply, fill #0

## 2021-10-23 NOTE — Progress Notes (Signed)
BH MD/PA/NP OP Progress Note Virtual Visit via Telephone Note  I connected with Edward Wolfe on 10/23/21 at  36:30 PM EDT by telephone and verified that I am speaking with the correct person using two identifiers.  Location: Patient: home Provider: Clinic   I discussed the limitations, risks, security and privacy concerns of performing an evaluation and management service by telephone and the availability of in person appointments. I also discussed with the patient that there may be a patient responsible charge related to this service. The patient expressed understanding and agreed to proceed.   I provided 30 minutes of non-face-to-face time during this encounter.  10/23/2021 36:36 PM Edward Wolfe  MRN:  [id number]  Chief Complaint: "  I really need something for my bipolar disorder and I have not been able to pick up my meds"  HPI:  35 year old male seen today for follow up psychiatric evaluation.  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  He is currently managed on trazodone 100 mg nightly and Zoloft 100 mg daily.  He notes he was unable to pick up his medication and currently not feeling mentally stable.   Today patient was unable to login virtually so his assessment was done over the phone.  During exam he was pleasant, cooperative, and engaged in conversation.  Patient notes that since his last visit he has been having issues refilling his medications.  He endorses symptoms of hypomania such as racing thoughts, irritability, distractibility, fluctuations in mood, and auditory hallucinations.  He notes that he hears voices and reports that he believes that the voice of his great-grandmother however notes that he is uncertain.  He also notes that most days he is anxious and depressed.  Provider conducted a GAD-7 and patient scored a 19,.  Provider also conducted PHQ-9 and patient scored a 17.  Patient  notes that he occasionally drinks alcohol and smokes marijuana however notes that this is not something that he does daily.  Provider informed patient that substance use can exacerbate his mental health.  He endorsed understanding and agreed.  Patient notes his sleep has been poor noting he sleeps 2 to 4 hours nightly.  He informed Clinical research associate that his appetite has increased and reports that he has gained 20 pounds since his last visit.   Today patient is agreeable to restarting Zoloft 100 mg and trazodone 100 mg to help manage anxiety, depression, and sleep.  Patient also agreeable to start Abilify 5 mg to help manage mood and symptoms of psychosis. Potential side effects of medication and risks vs benefits of treatment vs non-treatment were explained and discussed. All questions were answered.  No other concerns noted at this time. Visit Diagnosis:    ICD-10-CM   1. Bipolar I disorder, most recent episode depressed (HCC)  F31.30 sertraline (ZOLOFT) 100 MG tablet    traZODone (DESYREL) 100 MG tablet    ARIPiprazole (ABILIFY) 5 MG tablet      Past Psychiatric History: amphetamine and psychostimulant induced psychosis, bipolar 1, and substance-induced mood disorder.  Past Medical History: No past medical history on file. No past surgical history on file.  Family Psychiatric History: unknown  Family History: No family history on file.  Social History:  Social History   Socioeconomic History   Marital status: Single    Spouse name: Not on file   Number of children: Not on file   Years of education: Not on file   Highest education  level: Not on file  Occupational History   Not on file  Tobacco Use   Smoking status: Every Day    Packs/day: 1.00    Types: Cigarettes   Smokeless tobacco: Never  Substance and Sexual Activity   Alcohol use: Yes   Drug use: Yes    Types: Marijuana, Methamphetamines    Comment: methamphatmines last use 3 weeks ago.    Sexual activity: Yes    Birth  control/protection: Condom  Other Topics Concern   Not on file  Social History Narrative   Not on file   Social Determinants of Health   Financial Resource Strain: High Risk   Difficulty of Paying Living Expenses: Hard  Food Insecurity: No Food Insecurity   Worried About Running Out of Food in the Last Year: Never true   Ran Out of Food in the Last Year: Never true  Transportation Needs: No Transportation Needs   Lack of Transportation (Medical): No   Lack of Transportation (Non-Medical): No  Physical Activity: Inactive   Days of Exercise per Week: 0 days   Minutes of Exercise per Session: 0 min  Stress: Stress Concern Present   Feeling of Stress : Rather much  Social Connections: Not on file    Allergies:  Allergies  Allergen Reactions   Morphine And Related     "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 zombie"    Metabolic Disorder Labs: Lab Results  Component Value Date   HGBA1C 5.3 01/16/2021   MPG 105.41 01/16/2021   Lab Results  Component Value Date   PROLACTIN 11.8 01/16/2021   Lab Results  Component Value Date   CHOL 236 (H) 01/16/2021   TRIG 143 01/16/2021   HDL 50 01/16/2021   CHOLHDL 4.7 01/16/2021   VLDL 29 01/16/2021   LDLCALC 157 (H) 01/16/2021   Lab Results  Component Value Date   TSH 2.726 01/16/2021    Therapeutic Level Labs: No results found for: LITHIUM No results found for: VALPROATE No components found for:  CBMZ  Current Medications: Current Outpatient Medications  Medication Sig Dispense Refill   ARIPiprazole (ABILIFY) 5 MG tablet Take 1 tablet (5 mg total) by mouth daily. 30 tablet 3   baclofen (LIORESAL) 10 MG tablet TAKE 1 TABLET (10 MG TOTAL) BY MOUTH 3 (THREE) TIMES DAILY. 90 tablet 1   doxycycline (VIBRAMYCIN) 100 MG capsule Take 1 capsule (100 mg total) by mouth 2 (two) times daily. 20 capsule 0   sertraline (ZOLOFT) 100 MG tablet TAKE 1 TABLET (100 MG TOTAL) BY MOUTH DAILY. 30 tablet 3   traZODone (DESYREL) 100 MG tablet TAKE 1 TABLET (100 MG TOTAL)  BY MOUTH AT BEDTIME AS NEEDED FOR SLEEP. 30 tablet 3   No current facility-administered medications for this visit.     Musculoskeletal: Strength & Muscle Tone:  Unable to assess due to telephone Gait & Station:  Unable to assess due to telephone Patient leans: N/A  Psychiatric Specialty Exam: Review of Systems  There were no vitals taken for this visit.There is no height or weight on file to calculate BMI.  General Appearance:  Unable to assess due to telephone  Eye Contact:   Unable to assess due to telephone  Speech:  Clear and Coherent and Normal Rate  Volume:  Normal  Mood:  Anxious and Depressed  Affect:  Appropriate and Congruent  Thought Process:  Coherent, Goal Directed, and Linear  Orientation:  Full (Time, Place, and Person)  Thought Content: Logical and Hallucinations: Auditory   Suicidal Thoughts:  No  Homicidal Thoughts:  No  Memory:  Immediate;   Good Recent;   Good Remote;   Good  Judgement:  Good  Insight:  Good  Psychomotor Activity:   Unable to assess due to telephone  Concentration:  Concentration: Good and Attention Span: Good  Recall:  Good  Fund of Knowledge: Good  Language: Good  Akathisia:   Unable to assess due to telephone  Handed:  Right  AIMS (if indicated): Unable to assess due to telephone  Assets:  Communication Skills Desire for Improvement Housing Intimacy Leisure Time Physical Health Social Support  ADL's:  Intact  Cognition: WNL  Sleep:  Poor   Screenings: GAD-7    Flowsheet Row Video Visit from 10/23/2021 in [facility]  Total GAD-7 Score 19      PHQ2-9    Flowsheet Row Video Visit from 10/23/2021 in [facility] ED from 01/16/2021 in [facility]  PHQ-2 Total Score 5 2  PHQ-9 Total Score 17 [facility] ED from 09/01/2021 in [place] DEPT ED from 06/18/2021 in [place] [facility]PT ED from 04/12/2021 in [facility] [place] No Risk No Risk No Risk        Assessment and Plan: Patient endorses symptoms of hypomania, insomnia, anxiety, and depression.  Today he is agreeable to restarting Zoloft 100 mg daily to help manage anxiety and depression.  He is also agreeable to restarting trazodone 100 mg to help manage sleep, anxiety, depression.  He will start Abilify 5 mg to help manage symptoms of mood and psychosis.  1. Bipolar I disorder, most recent episode depressed ([place])  Restart- sertraline (ZOLOFT) 100 MG tablet; TAKE 1 TABLET (100 MG TOTAL) BY MOUTH DAILY.  Dispense: 30 tablet; Refill: 3 Restart- traZODone (DESYREL) 100 MG tablet; TAKE 1 TABLET (100 MG TOTAL) BY MOUTH AT BEDTIME AS NEEDED FOR SLEEP.  Dispense: 30 tablet; Refill: 3 Start- ARIPiprazole (ABILIFY) 5 MG tablet; Take 1 tablet (5 mg total) by mouth daily.  Dispense: 30 tablet; Refill: 3   Follow-up in 3 months Salley Slaughter, NP 10/23/2021, 2:36 PM

## 2021-10-24 ENCOUNTER — Other Ambulatory Visit: Payer: Self-pay

## 2021-10-27 ENCOUNTER — Other Ambulatory Visit: Payer: Self-pay

## 2021-10-28 ENCOUNTER — Emergency Department (HOSPITAL_COMMUNITY)
Admission: EM | Admit: 2021-10-28 | Discharge: 2021-10-29 | Disposition: A | Discharge status: Other Acute Inpatient Hospital | Payer: Self-pay | Attending: Emergency Medicine | Admitting: Emergency Medicine

## 2021-10-28 ENCOUNTER — Other Ambulatory Visit: Payer: Self-pay

## 2021-10-28 ENCOUNTER — Encounter (HOSPITAL_COMMUNITY): Payer: Self-pay

## 2021-10-28 DIAGNOSIS — R45851 Suicidal ideations: Secondary | ICD-10-CM | POA: Insufficient documentation

## 2021-10-28 DIAGNOSIS — X789XXA Intentional self-harm by unspecified sharp object, initial encounter: Secondary | ICD-10-CM | POA: Insufficient documentation

## 2021-10-28 DIAGNOSIS — F17210 Nicotine dependence, cigarettes, uncomplicated: Secondary | ICD-10-CM | POA: Insufficient documentation

## 2021-10-28 DIAGNOSIS — Z9189 Other specified personal risk factors, not elsewhere classified: Secondary | ICD-10-CM

## 2021-10-28 DIAGNOSIS — Z046 Encounter for general psychiatric examination, requested by authority: Secondary | ICD-10-CM | POA: Insufficient documentation

## 2021-10-28 DIAGNOSIS — Y900 Blood alcohol level of less than 20 mg/100 ml: Secondary | ICD-10-CM | POA: Insufficient documentation

## 2021-10-28 DIAGNOSIS — Z20822 Contact with and (suspected) exposure to covid-19: Secondary | ICD-10-CM | POA: Insufficient documentation

## 2021-10-28 DIAGNOSIS — S51812A Laceration without foreign body of left forearm, initial encounter: Secondary | ICD-10-CM | POA: Insufficient documentation

## 2021-10-28 LAB — CBC WITH DIFFERENTIAL/PLATELET
Abs Immature Granulocytes: 0.02 10*3/uL (ref 0.00–0.07)
Basophils Absolute: 0.1 10*3/uL (ref 0.0–0.1)
Basophils Relative: 1 %
Eosinophils Absolute: 0.1 10*3/uL (ref 0.0–0.5)
Eosinophils Relative: 1 %
HCT: 44.9 % (ref 39.0–52.0)
Hemoglobin: 14.7 g/dL (ref 13.0–17.0)
Immature Granulocytes: 0 %
Lymphocytes Relative: 29 %
Lymphs Abs: 2.3 10*3/uL (ref 0.7–4.0)
MCH: 28 pg (ref 26.0–34.0)
MCHC: 32.7 g/dL (ref 30.0–36.0)
MCV: 85.5 fL (ref 80.0–100.0)
Monocytes Absolute: 0.7 10*3/uL (ref 0.1–1.0)
Monocytes Relative: 9 %
Neutro Abs: 4.9 10*3/uL (ref 1.7–7.7)
Neutrophils Relative %: 60 %
Platelets: 190 10*3/uL (ref 150–400)
RBC: 5.25 MIL/uL (ref 4.22–5.81)
RDW: 14.3 % (ref 11.5–15.5)
WBC: 8.1 10*3/uL (ref 4.0–10.5)
nRBC: 0 % (ref 0.0–0.2)

## 2021-10-28 LAB — COMPREHENSIVE METABOLIC PANEL
ALT: 30 U/L (ref 0–44)
AST: 25 U/L (ref 15–41)
Albumin: 4.2 g/dL (ref 3.5–5.0)
Alkaline Phosphatase: 57 U/L (ref 38–126)
Anion gap: 8 (ref 5–15)
BUN: 17 mg/dL (ref 6–20)
CO2: 27 mmol/L (ref 22–32)
Calcium: 9.4 mg/dL (ref 8.9–10.3)
Chloride: 103 mmol/L (ref 98–111)
Creatinine, Ser: 0.72 mg/dL (ref 0.61–1.24)
GFR, Estimated: >60 mL/min (ref >60)
Glucose, Bld: 100 mg/dL — ABNORMAL HIGH (ref 70–99)
Potassium: 4 mmol/L (ref 3.5–5.1)
Sodium: 138 mmol/L (ref 135–145)
Total Bilirubin: 0.6 mg/dL (ref 0.3–1.2)
Total Protein: 7.4 g/dL (ref 6.5–8.1)

## 2021-10-28 LAB — RESP PANEL BY RT-PCR (FLU A&B, COVID) ARPGX2
Influenza A by PCR: NEGATIVE
Influenza B by PCR: NEGATIVE
SARS Coronavirus 2 by RT PCR: NEGATIVE

## 2021-10-28 LAB — ETHANOL: Alcohol, Ethyl (B): <10 mg/dL (ref <10)

## 2021-10-28 LAB — RAPID URINE DRUG SCREEN, HOSP PERFORMED
Amphetamines: POSITIVE — AB
Barbiturates: NOT DETECTED
Benzodiazepines: NOT DETECTED
Cocaine: NOT DETECTED
Opiates: NOT DETECTED
Tetrahydrocannabinol: POSITIVE — AB

## 2021-10-28 MED ORDER — SERTRALINE HCL 50 MG PO TABS
100.0000 mg | ORAL_TABLET | Freq: Every day | ORAL | Status: DC
  Administered 2021-10-28 – 2021-10-29 (×2): 100 mg via ORAL
  Filled 2021-10-28 (×2): qty 2

## 2021-10-28 MED ORDER — ARIPIPRAZOLE 5 MG PO TABS
5.0000 mg | ORAL_TABLET | Freq: Every day | ORAL | Status: DC
  Administered 2021-10-28 – 2021-10-29 (×2): 5 mg via ORAL
  Filled 2021-10-28 (×2): qty 1
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
## 2021-10-28 NOTE — ED Notes (Signed)
Save blue tube in main lab °
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
## 2021-10-28 NOTE — ED Triage Notes (Signed)
Pt reports being suicidal x 2 months. Pt states that he has been out of his medications x 2 months. Pt has a self induced laceration on his left forearm. He states that he did it with a piece of glass.

## 2021-10-28 NOTE — ED Notes (Signed)
Pt has been changed and wand by security.   Pt has one bag of belongings at triage desk.

## 2021-10-28 NOTE — ED Provider Notes (Addendum)
Nursing notified me patient wanting to leave.  Prior notes reviewed. Here initially for suicide attempt by cutting wrist. Initially not under IVC, here voluntarily however per previous providers note, patient will need to be IVCd due to suicide attempt if attempts to leave ED prior to psych evaluation.   Patient agitated and aggressive, [employer] at bedside.  Vague when asking SI, HI questions. States "leave me the hell alone" and will not answer direct questions aside from yelling.  Patient placed under IVC due to danger to self with suicide attempt.     Gracelyn Coventry A, PA-C 10/28/21 0847    Glendora Score, MD 10/28/21 [phone]

## 2021-10-29 ENCOUNTER — Encounter (HOSPITAL_COMMUNITY): Payer: Self-pay | Admitting: Psychiatry

## 2021-10-29 ENCOUNTER — Encounter: Payer: Self-pay | Admitting: Pharmacist

## 2021-10-29 ENCOUNTER — Other Ambulatory Visit: Payer: Self-pay

## 2021-10-29 ENCOUNTER — Inpatient Hospital Stay (HOSPITAL_COMMUNITY)
Admission: AD | Admit: 2021-10-29 | Discharge: 2021-11-04 | DRG: 885 | Disposition: A | Discharge status: Home / Self Care | Payer: Federal, State, Local not specified - Other | Source: Intra-hospital | Attending: Family | Admitting: Family

## 2021-10-29 DIAGNOSIS — F319 Bipolar disorder, unspecified: Secondary | ICD-10-CM

## 2021-10-29 DIAGNOSIS — F3130 Bipolar disorder, current episode depressed, mild or moderate severity, unspecified: Secondary | ICD-10-CM

## 2021-10-29 DIAGNOSIS — F411 Generalized anxiety disorder: Secondary | ICD-10-CM

## 2021-10-29 DIAGNOSIS — Z9151 Personal history of suicidal behavior: Secondary | ICD-10-CM | POA: Diagnosis not present

## 2021-10-29 DIAGNOSIS — R45851 Suicidal ideations: Secondary | ICD-10-CM | POA: Diagnosis present

## 2021-10-29 DIAGNOSIS — F3162 Bipolar disorder, current episode mixed, moderate: Secondary | ICD-10-CM | POA: Diagnosis present

## 2021-10-29 DIAGNOSIS — G4700 Insomnia, unspecified: Secondary | ICD-10-CM | POA: Diagnosis present

## 2021-10-29 DIAGNOSIS — F1520 Other stimulant dependence, uncomplicated: Secondary | ICD-10-CM

## 2021-10-29 DIAGNOSIS — F17210 Nicotine dependence, cigarettes, uncomplicated: Secondary | ICD-10-CM | POA: Diagnosis present

## 2021-10-29 DIAGNOSIS — Z79899 Other long term (current) drug therapy: Secondary | ICD-10-CM | POA: Diagnosis not present

## 2021-10-29 DIAGNOSIS — F17200 Nicotine dependence, unspecified, uncomplicated: Secondary | ICD-10-CM

## 2021-10-29 HISTORY — DX: Bipolar disorder, unspecified: F31.9

## 2021-10-29 MED ORDER — MAGNESIUM HYDROXIDE 400 MG/5ML PO SUSP: 30.0000 mL | Freq: Every day | ORAL | Status: DC | PRN

## 2021-10-29 MED ORDER — ACETAMINOPHEN 325 MG PO TABS
650.0000 mg | ORAL_TABLET | Freq: Four times a day (QID) | ORAL | Status: DC | PRN
  Administered 2021-10-31: 650 mg via ORAL
  Filled 2021-10-29: qty 2

## 2021-10-29 MED ORDER — ARIPIPRAZOLE 5 MG PO TABS
5.0000 mg | ORAL_TABLET | Freq: Every day | ORAL | Status: DC
  Administered 2021-10-30 – 2021-11-04 (×6): 5 mg via ORAL
  Filled 2021-10-29 (×8): qty 1

## 2021-10-29 MED ORDER — SERTRALINE HCL 100 MG PO TABS
100.0000 mg | ORAL_TABLET | Freq: Every day | ORAL | Status: DC
  Filled 2021-10-29 (×2): qty 1

## 2021-10-29 MED ORDER — HYDROXYZINE HCL 25 MG PO TABS
25.0000 mg | ORAL_TABLET | Freq: Three times a day (TID) | ORAL | Status: DC | PRN
  Filled 2021-10-29: qty 10

## 2021-10-29 MED ORDER — TRAZODONE HCL 50 MG PO TABS
50.0000 mg | ORAL_TABLET | Freq: Every evening | ORAL | Status: DC | PRN
  Administered 2021-11-02 – 2021-11-03 (×2): 50 mg via ORAL
  Filled 2021-10-29: qty 1
  Filled 2021-10-29: qty 7
  Filled 2021-10-29: qty 1

## 2021-10-29 MED ORDER — ALUM & MAG HYDROXIDE-SIMETH 200-200-20 MG/5ML PO SUSP: 30.0000 mL | ORAL | Status: DC | PRN
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
## 2021-10-29 NOTE — Progress Notes (Signed)
Patient information has been sent to [facility] via secure chat to review for potential admission. Patient has not yet been accepted at this time. Patient meets inpatient criteria per Karel Jarvis, PA .   Situation ongoing, CSW will continue to monitor and update note as more information becomes available.    Signed:  Corky Crafts, MSW, [place], LCASA 10/29/2021 9:49 AM

## 2021-10-29 NOTE — Tx Team (Signed)
Initial Treatment Plan 10/29/2021 3:12 PM Edward Wolfe [id number]    PATIENT STRESSORS: Financial difficulties   Marital or family conflict   Medication change or noncompliance   Substance abuse     PATIENT STRENGTHS: Ability for insight  Average or above average intelligence  Communication skills  General fund of knowledge    PATIENT IDENTIFIED PROBLEMS: DepressionAnxiety  Self-harm behaviors and ideations  Housing  Relationship               DISCHARGE CRITERIA:  Ability to meet basic life and health needs Improved stabilization in mood, thinking, and/or behavior Safe-care adequate arrangements made Verbal commitment to aftercare and medication compliance  PRELIMINARY DISCHARGE PLAN: Outpatient therapy Return to previous living arrangement Return to previous work or school arrangements  PATIENT/FAMILY INVOLVEMENT: This treatment plan has been presented to and reviewed with the patient, Edward Wolfe.  The patient has been given the opportunity to ask questions and make suggestions.  Olin Pia, RN 10/29/2021, 3:12 PM

## 2021-10-29 NOTE — ED Provider Notes (Signed)
Emergency Medicine Observation Re-evaluation Note  Edward Wolfe is a 35 y.o. male, seen on rounds today.  Pt initially presented to the ED for complaints of Suicidal and Psychiatric Evaluation Currently, the patient is resting and cooperative.  Physical Exam  BP 117/62 (BP Location: Right Arm)   Pulse (!) 40   Temp 98.4 F (36.9 C) (Oral)   Resp 18   Ht 6' (1.829 m)   Wt 90.7 kg   SpO2 97%   BMI 27.12 kg/m  Physical Exam General: resting comfortably, NAD Lungs: normal WOB Psych: currently calm and resting  ED Course / MDM  EKG:EKG Interpretation  Date/Time:  Tuesday October 28 2021 03:35:28 EST Ventricular Rate:  98 PR Interval:  134 QRS Duration: 119 QT Interval:  424 QTC Calculation: 487 R Axis:   42 Text Interpretation: Sinus rhythm Paired ventricular premature complexes Incomplete right bundle branch block No significant change was found Confirmed by Glynn Octave [phone]) on 10/28/2021 3:50:57 AM  I have reviewed the labs performed to date as well as medications administered while in observation.  Recent changes in the last 24 hours include patient attempting to leave, patient was placed under IVC by previous staff.  Plan  Current plan is for inpatient psychiatric treatment. Edward Wolfe is under involuntary commitment.      Rozelle Logan, DO 10/29/21 [phone]

## 2021-10-29 NOTE — Group Note (Unsigned)
Group Topic: Other  Group Date: 10/29/2021 Start Time: 1600 End Time: 1700 Facilitators: Guss Bunde  [facility]  Number of Participants: 9  Group Focus: personal responsibility Treatment Modality:  Psychoeducation Interventions utilized were other  Purpose: Encourage patients to accept personal responsiblity   Name: Edward Wolfe Date of Birth: [birth year]  MR: [id number]    Level of Participation: {THERAPIES; PSYCH GROUP PARTICIPATION HERDE:08144} Quality of Participation: {THERAPIES; PSYCH QUALITY OF PARTICIPATION:23992} Interactions with others: {THERAPIES; PSYCH INTERACTIONS:23993} Mood/Affect: {THERAPIES; PSYCH MOOD/AFFECT:23994} Triggers (if applicable): *** Cognition: {THERAPIES; PSYCH COGNITION:23995} Progress: {THERAPIES; PSYCH PROGRESS:23997} Response: *** Plan: {THERAPIES; PSYCH YJEH:63149}  Patients Problems:  Patient Active Problem List   Diagnosis Date Noted   Bipolar disorder (HCC) 10/29/2021   Bipolar 1 disorder, mixed, moderate (HCC) 01/17/2021   Substance induced mood disorder (HCC) 01/17/2021   Amphetamine and psychostimulant-induced psychosis with delusions (HCC)    Bipolar I disorder, most recent episode depressed (HCC)

## 2021-10-29 NOTE — ED Notes (Signed)
[facility] called for transport 

## 2021-10-29 NOTE — Plan of Care (Signed)
Newly admitted. Flat and depressed and expressing guilt and anger toward self. He contracts for safety. Patient's pulse was checked manually : 48. Staff to push fluids. Was offered Gatorade. Was educated about safety precautions and verbalized understanding.

## 2021-10-29 NOTE — ED Notes (Signed)
Report given to [facility], waiting on IVC paperwork to be faxed and pt will be transported to [facility]

## 2021-10-29 NOTE — Progress Notes (Signed)
Pt accepted to [facility] Adult rm 303-2  Patient meets inpatient criteria per Karel Jarvis, PA     The attending provider will be Phineas Inches, MD  Call report to [phone]    Elijio Miles, RN @ [facility] ED notified.     Pt scheduled  to arrive at [facility] at 1300 PM, pending IVC paperwork be faxed to [phone]  Signed:  Corky Crafts, MSW, LCSWA, LCASA 10/29/2021 12:02 PM

## 2021-10-30 ENCOUNTER — Other Ambulatory Visit: Payer: Self-pay

## 2021-10-30 DIAGNOSIS — F319 Bipolar disorder, unspecified: Principal | ICD-10-CM

## 2021-10-30 LAB — HEMOGLOBIN A1C
Hgb A1c MFr Bld: 5.1 % (ref 4.8–5.6)
Mean Plasma Glucose: 99.67 mg/dL

## 2021-10-30 LAB — LIPID PANEL
Cholesterol: 204 mg/dL — ABNORMAL HIGH (ref 0–200)
HDL: 45 mg/dL (ref >40)
LDL Cholesterol: 136 mg/dL — ABNORMAL HIGH (ref 0–99)
Total CHOL/HDL Ratio: 4.5 RATIO
Triglycerides: 113 mg/dL (ref <150)
VLDL: 23 mg/dL (ref 0–40)

## 2021-10-30 LAB — TSH: TSH: 1.831 u[IU]/mL (ref 0.350–4.500)

## 2021-10-30 MED ORDER — SERTRALINE HCL 50 MG PO TABS
50.0000 mg | ORAL_TABLET | Freq: Every day | ORAL | Status: DC
  Administered 2021-10-30 – 2021-10-31 (×2): 50 mg via ORAL
  Filled 2021-10-30 (×5): qty 1
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
## 2021-10-30 NOTE — Plan of Care (Signed)
Nurse discussed anxiety, depression and coping skills with patient.  

## 2021-10-30 NOTE — Progress Notes (Signed)
   10/29/21 2332  Psych Admission Type (Psych Patients Only)  Admission Status Voluntary  Psychosocial Assessment  Patient Complaints Anxiety  Eye Contact Brief  Facial Expression Anxious;Sad  Affect Sad  Speech Logical/coherent  Interaction Assertive  Motor Activity Slow  Appearance/Hygiene In scrubs  Behavior Characteristics Appropriate to situation  Mood Depressed  Thought Process  Coherency WDL  Content WDL  Perception WDL  Hallucination Auditory  Judgment Poor  Danger to Self  Current suicidal ideation? Denies  Danger to Others  Danger to Others None reported or observed  Danger to Others Abnormal  Harmful Behavior to others No threats or harm toward other people  Destructive Behavior No threats or harm toward property

## 2021-10-30 NOTE — [facility] Group Notes (Signed)
Pt did not attend group. 

## 2021-10-30 NOTE — [facility] Group Notes (Signed)
Relaxation and Psycho-ed Group were combined. Healthy coping skills were discussed as it attributes to promoting mental well being. Discussed with patients that mental health journey is like a pie, and that healthy coping skills add value to mental well being. Identified unhealthy coping skills and how they play negative impact, and identified positive healthy coping skills that are free, accessible and easily implemented. Closed group with deep breathing practice and education regarding stress reduction.
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
## 2021-10-30 NOTE — [facility] Group Notes (Signed)
Pt didn't attend group. 

## 2021-10-30 NOTE — Progress Notes (Signed)
D:  Patient denied SI and HI, contracts for safety.  Denied A/V hallucinations.   A:  Medications administered per MD orders.  Emotional support and encouragement given patient. R:  Safety maintained with 15 minute checks.  

## 2021-10-31 ENCOUNTER — Encounter (HOSPITAL_COMMUNITY): Payer: Self-pay

## 2021-10-31 MED ORDER — SERTRALINE HCL 25 MG PO TABS
75.0000 mg | ORAL_TABLET | Freq: Every day | ORAL | Status: DC
  Administered 2021-11-01 – 2021-11-04 (×4): 75 mg via ORAL
  Filled 2021-10-31 (×6): qty 3

## 2021-10-31 MED ORDER — BOOST / RESOURCE BREEZE PO LIQD CUSTOM
237.0000 mL | Freq: Two times a day (BID) | ORAL | Status: DC
  Administered 2021-10-31 – 2021-11-04 (×9): 1 via ORAL
  Filled 2021-10-31 (×13): qty 1

## 2021-10-31 MED ORDER — ADULT MULTIVITAMIN W/MINERALS CH
1.0000 | ORAL_TABLET | Freq: Every day | ORAL | Status: DC
  Administered 2021-11-01 – 2021-11-04 (×4): 1 via ORAL
  Filled 2021-10-31 (×7): qty 1

## 2021-10-31 MED ORDER — GABAPENTIN 100 MG PO CAPS
100.0000 mg | ORAL_CAPSULE | Freq: Three times a day (TID) | ORAL | Status: DC
Start: 2021-10-31 — End: 2021-11-03
  Administered 2021-10-31 – 2021-11-03 (×9): 100 mg via ORAL
  Filled 2021-10-31 (×15): qty 1

## 2021-10-31 NOTE — [facility] Group Notes (Signed)
Pt did not attend relaxation group.  

## 2021-10-31 NOTE — Progress Notes (Signed)
   10/30/21 1945  Psych Admission Type (Psych Patients Only)  Admission Status Voluntary  Psychosocial Assessment  Patient Complaints Anxiety  Eye Contact Brief  Facial Expression Sad;Flat  Affect Sad  Speech Logical/coherent  Interaction Assertive  Motor Activity Other (Comment) (WDL)  Appearance/Hygiene In scrubs  Behavior Characteristics Cooperative;Appropriate to situation  Mood Depressed  Thought Process  Coherency WDL  Content WDL  Delusions None reported or observed  Perception WDL  Hallucination Auditory  Judgment Poor  Confusion None  Danger to Self  Current suicidal ideation? Denies  Danger to Others  Danger to Others None reported or observed

## 2021-10-31 NOTE — [facility] Group Notes (Signed)
Pt did not attend. 

## 2021-10-31 NOTE — Progress Notes (Signed)
   10/31/21 1400  Psych Admission Type (Psych Patients Only)  Admission Status Voluntary  Psychosocial Assessment  Patient Complaints Anxiety  Eye Contact Brief  Facial Expression Sad;Flat  Affect Sad  Speech Logical/coherent  Interaction Assertive  Motor Activity Other (Comment) (WDL)  Appearance/Hygiene In scrubs  Behavior Characteristics Cooperative;Appropriate to situation  Mood Depressed  Thought Process  Coherency WDL  Content WDL  Delusions None reported or observed  Perception WDL  Hallucination Auditory  Judgment Poor  Confusion None  Danger to Self  Current suicidal ideation? Denies  Danger to Others  Danger to Others None reported or observed  Danger to Others Abnormal  Harmful Behavior to others No threats or harm toward other people  Destructive Behavior No threats or harm toward property

## 2021-10-31 NOTE — [facility] Group Notes (Signed)
Pt did not attend music therapy group.

## 2021-10-31 NOTE — [facility] Group Notes (Signed)
Pt did not attend goals ggroup.

## 2021-10-31 NOTE — BH IP Treatment Plan (Signed)
Interdisciplinary Treatment and Diagnostic Plan Update  10/31/2021 Time of Session: \ Mirl Hillery MRN: [id number]  Principal Diagnosis: <principal problem not specified>  Secondary Diagnoses: Active Problems:   Bipolar disorder ([place])   Current Medications:  Current Facility-Administered Medications  Medication Dose Route Frequency Provider Last Rate Last Admin   acetaminophen (TYLENOL) tablet 650 mg  650 mg Oral Q6H PRN Chalmers Guest, NP   650 mg at 10/31/21 0931   alum & mag hydroxide-simeth (MAALOX/MYLANTA) 200-200-20 MG/5ML suspension 30 mL  30 mL Oral Q4H PRN Chalmers Guest, NP       ARIPiprazole (ABILIFY) tablet 5 mg  5 mg Oral Daily Chalmers Guest, NP   5 mg at 10/31/21 0900   feeding supplement (BOOST / RESOURCE BREEZE) liquid 1 Container  237 mL Oral BID BM Massengill, Ovid Curd, MD       hydrOXYzine (ATARAX/VISTARIL) tablet 25 mg  25 mg Oral TID PRN Chalmers Guest, NP       magnesium hydroxide (MILK OF MAGNESIA) suspension 30 mL  30 mL Oral Daily PRN Chalmers Guest, NP       multivitamin with minerals tablet 1 tablet  1 tablet Oral Daily Massengill, Ovid Curd, MD       sertraline (ZOLOFT) tablet 50 mg  50 mg Oral Daily Massengill, Ovid Curd, MD   50 mg at 10/31/21 0900   traZODone (DESYREL) tablet 50 mg  50 mg Oral QHS PRN Chalmers Guest, NP       PTA Medications: Medications Prior to Admission  Medication Sig Dispense Refill Last Dose   ARIPiprazole (ABILIFY) 5 MG tablet Take 1 tablet (5 mg total) by mouth daily. 30 tablet 3    baclofen (LIORESAL) 10 MG tablet TAKE 1 TABLET (10 MG TOTAL) BY MOUTH 3 (THREE) TIMES DAILY. (Patient not taking: Reported on 10/28/2021) 90 tablet 1    doxycycline (VIBRAMYCIN) 100 MG capsule Take 1 capsule (100 mg total) by mouth 2 (two) times daily. (Patient not taking: Reported on 10/28/2021) 20 capsule 0    sertraline (ZOLOFT) 100 MG tablet TAKE 1 TABLET (100 MG TOTAL) BY MOUTH DAILY. 30 tablet 3    traZODone (DESYREL) 100 MG tablet TAKE 1 TABLET (100  MG TOTAL) BY MOUTH AT BEDTIME AS NEEDED FOR SLEEP. 30 tablet 3     Patient Stressors: Financial difficulties   Marital or family conflict   Medication change or noncompliance   Substance abuse    Patient Strengths: Ability for insight  Average or above average intelligence  Communication skills  General fund of knowledge   Treatment Modalities: Medication Management, Group therapy, Case management,  1 to 1 session with clinician, Psychoeducation, Recreational therapy.   Physician Treatment Plan for Primary Diagnosis: <principal problem not specified> Long Term Goal(s): Improvement in symptoms so as ready for discharge   Short Term Goals: Ability to identify changes in lifestyle to reduce recurrence of condition will improve Ability to verbalize feelings will improve Ability to disclose and discuss suicidal ideas Ability to demonstrate self-control will improve Ability to identify and develop effective coping behaviors will improve Ability to identify triggers associated with substance abuse/mental health issues will improve Compliance with prescribed medications will improve  Medication Management: Evaluate patient's response, side effects, and tolerance of medication regimen.  Therapeutic Interventions: 1 to 1 sessions, Unit Group sessions and Medication administration.  Evaluation of Outcomes: Not Met  Physician Treatment Plan for Secondary Diagnosis: Active Problems:   Bipolar disorder ([place])  Long Term Goal(s): Improvement in  symptoms so as ready for discharge   Short Term Goals: Ability to identify changes in lifestyle to reduce recurrence of condition will improve Ability to verbalize feelings will improve Ability to disclose and discuss suicidal ideas Ability to demonstrate self-control will improve Ability to identify and develop effective coping behaviors will improve Ability to identify triggers associated with substance abuse/mental health issues will  improve Compliance with prescribed medications will improve     Medication Management: Evaluate patient's response, side effects, and tolerance of medication regimen.  Therapeutic Interventions: 1 to 1 sessions, Unit Group sessions and Medication administration.  Evaluation of Outcomes: Not Met   RN Treatment Plan for Primary Diagnosis: <principal problem not specified> Long Term Goal(s): Knowledge of disease and therapeutic regimen to maintain health will improve  Short Term Goals: Ability to participate in decision making will improve, Ability to verbalize feelings will improve, and Ability to disclose and discuss suicidal ideas  Medication Management: RN will administer medications as ordered by provider, will assess and evaluate patient's response and provide education to patient for prescribed medication. RN will report any adverse and/or side effects to prescribing provider.  Therapeutic Interventions: 1 on 1 counseling sessions, Psychoeducation, Medication administration, Evaluate responses to treatment, Monitor vital signs and CBGs as ordered, Perform/monitor CIWA, COWS, AIMS and Fall Risk screenings as ordered, Perform wound care treatments as ordered.  Evaluation of Outcomes: Not Met   LCSW Treatment Plan for Primary Diagnosis: <principal problem not specified> Long Term Goal(s): Safe transition to appropriate next level of care at discharge, Engage patient in therapeutic group addressing interpersonal concerns.  Short Term Goals: Engage patient in aftercare planning with referrals and resources, Increase social support, and Increase ability to appropriately verbalize feelings  Therapeutic Interventions: Assess for all discharge needs, 1 to 1 time with Social worker, Explore available resources and support systems, Assess for adequacy in community support network, Educate family and significant other(s) on suicide prevention, Complete Psychosocial Assessment, Interpersonal group  therapy.  Evaluation of Outcomes: Not Met   Progress in Treatment: Attending groups: No. Participating in groups: No. Taking medication as prescribed: Yes. Toleration medication: Yes. Family/Significant other contact made: No, will contact:  CSW will obtain consent Patient understands diagnosis: Yes. Discussing patient identified problems/goals with staff: No. Medical problems stabilized or resolved: Yes. Denies suicidal/homicidal ideation: Yes. Issues/concerns per patient self-inventory: No. Other: None  New problem(s) identified: No, Describe:  None  New Short Term/Long Term Goal(s):medication stabilization, elimination of SI thoughts, development of comprehensive mental wellness plan.   Patient Goals:  Did Not Attend  Discharge Plan or Barriers: Patient recently admitted. CSW will continue to follow and assess for appropriate referrals and possible discharge planning.   Reason for Continuation of Hospitalization: Depression Medication stabilization Suicidal ideation  Estimated Length of Stay: 3-5 days   Scribe for Treatment Team: Eliott Nine 10/31/2021 10:20 AM

## 2021-10-31 NOTE — Progress Notes (Signed)
   10/31/21 2121  Psych Admission Type (Psych Patients Only)  Admission Status Voluntary  Psychosocial Assessment  Patient Complaints Anxiety  Eye Contact Brief;Fair  Facial Expression Flat  Affect Appropriate to circumstance  Speech Logical/coherent  Interaction Assertive;Guarded  Motor Activity Other (Comment) (in wheelchair)  Appearance/Hygiene In scrubs  Behavior Characteristics Cooperative;Appropriate to situation  Mood Depressed;Sad  Thought Process  Coherency WDL  Content WDL  Delusions None reported or observed  Perception WDL  Hallucination None reported or observed  Judgment Poor  Confusion None  Danger to Self  Current suicidal ideation? Denies  Danger to Others  Danger to Others None reported or observed

## 2021-10-31 NOTE — Progress Notes (Signed)
MHT informed RN of low pulse on dinamap of 30. RN took manual radial pulse and it was 36. Assessed patient with Melbourne Abts PA. Given PO fluids. Will continue to assess and monitor.

## 2021-10-31 NOTE — Progress Notes (Addendum)
[facility] MD Progress Note  10/31/2021 3:39 PM Edward Wolfe  MRN:  [id number] Subjective:    35 year old male with a past psychiatric history of bipolar disorder, diagnosed in 2008, who was admitted to the psychiatric unit for evaluation and treatment of worsening depression and suicide attempt by cutting himself on left wrist.    Patient case was discussed in interdisciplinary team this morning.  Patient reports his mood is some better but continues to be mostly down and depressed. He reports anxiety is less.  He reports back pain.  He reports poor sleep last night.  He denies side effects to current psychiatric medications. Reports suicidal thoughts are less frequent and less severe.  Denies homicidal thoughts.  Denies psychotic symptoms.  We discussed patient's bradycardia.  He reports he has been told he had low heart rate in the past.  He reports that his chronic.  Denies any symptoms related to bradycardia.  Principal Problem: Bipolar disorder (HCC) Diagnosis: Principal Problem:   Bipolar disorder (H[facility])  Total Time spent with patient: 20 minutes  Past Psychiatric History: See H&P  Past Medical History: History reviewed. No pertinent past medical history. History reviewed. No pertinent surgical history. Family History: History reviewed. No pertinent family history. Family Psychiatric  History: See H&P Social History:  Social History   Substance and Sexual Activity  Alcohol Use Yes     Social History   Substance and Sexual Activity  Drug Use Yes   Types: Marijuana, Methamphetamines   Comment: methamphatmines last use 3 weeks ago.     Social History   Socioeconomic History   Marital status: Single    Spouse name: Not on file   Number of children: Not on file   Years of education: Not on file   Highest education level: Not on file  Occupational History   Not on file  Tobacco Use   Smoking status: Every Day    Packs/day: 1.00    Types: Cigarettes   Smokeless  tobacco: Never  Substance and Sexual Activity   Alcohol use: Yes   Drug use: Yes    Types: Marijuana, Methamphetamines    Comment: methamphatmines last use 3 weeks ago.    Sexual activity: Yes    Birth control/protection: Condom  Other Topics Concern   Not on file  Social History Narrative   Not on file   Social Determinants of Health   Financial Resource Strain: High Risk   Difficulty of Paying Living Expenses: Hard  Food Insecurity: No Food Insecurity   Worried About Running Out of Food in the Last Year: Never true   Ran Out of Food in the Last Year: Never true  Transportation Needs: No Transportation Needs   Lack of Transportation (Medical): No   Lack of Transportation (Non-Medical): No  Physical Activity: Inactive   Days of Exercise per Week: 0 days   Minutes of Exercise per Session: 0 min  Stress: Stress Concern Present   Feeling of Stress : Rather much  Social Connections: Not on file   Additional Social History:                         Sleep: Poor  Appetite:  Good  Current Medications: Current Facility-Administered Medications  Medication Dose Route Frequency Provider Last Rate Last Admin   acetaminophen (TYLENOL) tablet 650 mg  650 mg Oral Q6H PRN Novella Olive, NP   650 mg at 10/31/21 0931   alum & mag hydroxide-simeth (MAALOX/MYLANTA) 200-200-20  MG/5ML suspension 30 mL  30 mL Oral Q4H PRN Novella Olive, NP       ARIPiprazole (ABILIFY) tablet 5 mg  5 mg Oral Daily Novella Olive, NP   5 mg at 10/31/21 0900   feeding supplement (BOOST / RESOURCE BREEZE) liquid 1 Container  237 mL Oral BID BM Rosalba Totty, Harrold Donath, MD       hydrOXYzine (ATARAX/VISTARIL) tablet 25 mg  25 mg Oral TID PRN Novella Olive, NP       magnesium hydroxide (MILK OF MAGNESIA) suspension 30 mL  30 mL Oral Daily PRN Novella Olive, NP       multivitamin with minerals tablet 1 tablet  1 tablet Oral Daily Annya Lizana, Harrold Donath, MD       sertraline (ZOLOFT) tablet 50 mg  50 mg Oral Daily  Jalaine Riggenbach, Harrold Donath, MD   50 mg at 10/31/21 0900   traZODone (DESYREL) tablet 50 mg  50 mg Oral QHS PRN Novella Olive, NP        Lab Results:  Results for orders placed or performed during the hospital encounter of 10/29/21 (from the past 48 hour(s))  Hemoglobin A1c     Status: None   Collection Time: 10/30/21  6:34 AM  Result Value Ref Range   Hgb A1c MFr Bld 5.1 4.8 - 5.6 %    Comment: (NOTE) Pre diabetes:          5.7%-6.4%  Diabetes:              >6.4%  Glycemic control for   <7.0% adults with diabetes    Mean Plasma Glucose 99.67 mg/dL    Comment: Performed at [facility], [address] [place], [place] [address]  Lipid panel     Status: Abnormal   Collection Time: 10/30/21  6:34 AM  Result Value Ref Range   Cholesterol 204 (H) 0 - 200 mg/dL   Triglycerides 903 <009 mg/dL   HDL 45 >23 mg/dL   Total CHOL/HDL Ratio 4.5 RATIO   VLDL 23 0 - 40 mg/dL   LDL Cholesterol 300 (H) 0 - 99 mg/dL    Comment:        Total Cholesterol/HDL:CHD Risk Coronary Heart Disease Risk Table                     Men   Women  1/2 Average Risk   3.4   3.3  Average Risk       5.0   4.4  2 X Average Risk   9.6   7.1  3 X Average Risk  23.4   11.0        Use the calculated Patient Ratio above and the CHD Risk Table to determine the patient's CHD Risk.        ATP III CLASSIFICATION (LDL):  <100     mg/dL   Optimal  762-263  mg/dL   Near or Above                    Optimal  130-159  mg/dL   Borderline  335-456  mg/dL   High  >256     mg/dL   Very High Performed at [facility], [address] [place], [place] [address]   TSH     Status: None   Collection Time: 10/30/21  6:34 AM  Result Value Ref Range   TSH 1.831 0.350 - 4.500 uIU/mL    Comment: Performed  by a 3rd Generation assay with a functional sensitivity of <=0.01 uIU/mL. Performed at [facility], [address] [place], [place] [address]     Blood Alcohol level:  Lab Results   Component Value Date   [facility] <10 10/28/2021   ETH <10 01/16/2021    Metabolic Disorder Labs: Lab Results  Component Value Date   HGBA1C 5.1 10/30/2021   MPG 99.67 10/30/2021   MPG 105.41 01/16/2021   Lab Results  Component Value Date   PROLACTIN 11.8 01/16/2021   Lab Results  Component Value Date   CHOL 204 (H) 10/30/2021   TRIG 113 10/30/2021   HDL 45 10/30/2021   CHOLHDL 4.5 10/30/2021   VLDL 23 10/30/2021   LDLCALC 136 (H) 10/30/2021   LDLCALC 157 (H) 01/16/2021    Physical Findings: AIMS: Facial and Oral Movements Muscles of Facial Expression: None, normal Lips and Perioral Area: None, normal Jaw: None, normal Tongue: None, normal,Extremity Movements Upper (arms, wrists, hands, fingers): None, normal Lower (legs, knees, ankles, toes): None, normal, Trunk Movements Neck, shoulders, hips: None, normal, Overall Severity Severity of abnormal movements (highest score from questions above): None, normal Incapacitation due to abnormal movements: None, normal Patient's awareness of abnormal movements (rate only patient's report): No Awareness, Dental Status Current problems with teeth and/or dentures?: No Does patient usually wear dentures?: No  CIWA:    COWS:     Musculoskeletal: Patient laying in bed today.  Did not have for musculoskeletal evaluation.  Uses cane at home to walk.  He is in wheelchair in the hospital.    Psychiatric Specialty Exam:  Presentation  General Appearance: Disheveled  Eye Contact:Fleeting  Speech:Normal Rate  Speech Volume:Decreased  Handedness:Right   Mood and Affect  Mood:Anxious; Dysphoric  Affect:Constricted   Thought Process  Thought Processes:Linear  Descriptions of Associations:Intact  Orientation:Full (Time, Place and Person)  Thought Content:Logical  History of Schizophrenia/Schizoaffective disorder:No  Duration of Psychotic Symptoms:No data recorded Hallucinations:Hallucinations: None  Ideas of  Reference:None  Suicidal Thoughts:Suicidal Thoughts: -- (reports suicidal thoughts are less frequent and less severe. denies intent or plan.) SI Passive Intent and/or Plan: Without Intent; Without Plan  Homicidal Thoughts:Homicidal Thoughts: No   Sensorium  Memory:Immediate Fair; Recent Fair; Remote Fair  Judgment:Fair  Insight:Fair   Executive Functions  Concentration:Fair  Attention Span:Fair  Recall:Fair  Fund of Knowledge:Fair  Language:Good   Psychomotor Activity  Psychomotor Activity:Psychomotor Activity: Normal   Assets  Assets:Desire for Improvement; Housing; Resilience   Sleep  Sleep:Sleep: Good    Physical Exam: Physical Exam Vitals reviewed.  Constitutional:      General: He is not in acute distress.    Appearance: He is normal weight. He is not ill-appearing or toxic-appearing.     Comments: Laying in bed.  Pulmonary:     Effort: Pulmonary effort is normal. No respiratory distress.  Neurological:     Mental Status: He is alert.   Review of Systems  Constitutional:  Negative for chills and fever.  Cardiovascular:  Negative for chest pain and palpitations.  Psychiatric/Behavioral:  Positive for depression. The patient is nervous/anxious.   Blood pressure (!) 119/58, pulse (!) 46, temperature 97.9 F (36.6 C), temperature source Oral, resp. rate 18, height 5\' 9"  (1.753 m), weight 89.4 kg, SpO2 98 %. Body mass index is 29.09 kg/m.   Treatment Plan Summary:  ASSESSMENT:   Diagnoses / Active Problems: Bipolar disorder Generalized anxiety disorder Methamphetamine use disorder Tobacco use disorder   PLAN: Safety and Monitoring:             --  Voluntary admission to inpatient psychiatric unit for safety, stabilization and treatment             -- Daily contact with patient to assess and evaluate symptoms and progress in treatment             -- Patient's case to be discussed in multi-disciplinary team meeting             -- Observation  Level : q15 minute checks             -- Vital signs:  q12 hours             -- Precautions: suicide, elopement, and assault   2. Psychiatric Diagnoses and Treatment:               Increase sertraline from 50 mg to 75 mg once daily for mood, anxiety Continue Abilify 5 mg once daily for mood stabilization Start gabapentin 100 mg 3 times daily, for anxiety and pain   --  The risks/benefits/side-effects/alternatives to this medication were discussed in detail with the patient and time was given for questions. The patient consents to medication trial.                -- Metabolic profile and EKG monitoring obtained while on an atypical antipsychotic (BMI: Lipid Panel: HbgA1c: QTc:) 487 -Monitor bradycardia.  Patient is asymptomatic.             -- Encouraged patient to participate in unit mAbility to identify changes in lifestyle to reduce recurrence of condition will improve, Ability to verbalize feelings will improve, Ability to disclose and discuss suicidal ideas, Ability to demonstrate self-control will improve, Ability to identify and develop effective coping behaviors will improve, Ability to maintain clinical measurements within normal limits will improve, Compliance with prescribed medications will improve, and Ability to identify triggers associated with substance abuse/mental health issues will improveilieu and in scheduled group therapies              -- Short Term Goals: Ability to identify changes in lifestyle to reduce recurrence of condition will improve             -- Long Term Goals: Improvement in symptoms so as ready for discharge                3. Medical Issues Being Addressed:              Tobacco Use Disorder             -- Nicotine patch 21mg /24 hours ordered             -- Smoking cessation encouraged   4. Discharge Planning:              -- Social work and case management to assist with discharge planning and identification of hospital follow-up needs prior to  discharge             -- Estimated LOS: 5-7 days             -- Discharge Concerns: Need to establish a safety plan; Medication compliance and effectiveness             -- Discharge Goals: Return home with outpatient referrals for mental health follow-up including medication management/psychotherapy  Total Time Spent in Direct Patient Care:  I personally spent 30 minutes on the unit in direct patient care. The direct patient care time included face-to-face time with the patient, reviewing the patient's chart, communicating  with other professionals, and coordinating care. Greater than 50% of this time was spent in counseling or coordinating care with the patient regarding goals of hospitalization, psycho-education, and discharge planning needs.   Phineas Inches, MD Psychiatrist    Cristy Hilts, MD 10/31/2021, 3:39 PM

## 2021-10-31 NOTE — Progress Notes (Signed)
NUTRITION ASSESSMENT  Pt identified as at risk on the Malnutrition Screen Tool  INTERVENTION:  -Ensure Enlive po BID, each supplement provides 350 kcal and 20 grams of protein  -MVI with minerals daily  NUTRITION DIAGNOSIS: Unintentional weight loss related to sub-optimal intake as evidenced by pt report.   Goal: Pt to meet >/= 90% of their estimated nutrition needs.  Monitor:  PO intake  Assessment:   Pt is a 35 yo male with PMH of bipolar disorder who presented to [facility] after cutting his forearm with a piece of glass from his apartment window in a suicide attempt. Pt transferred to [facility] for ongoing management of worsening depression and SI. Pt reported that he has been without his bipolar medications (Abilify, Zoloft, and Trazodone) for about 2 months now. He planned to go to the pharmacy to pick up his medications, but missed the bus. When he got home, his fiance accused him of cheating which he didn't take well, so he then decided to cut himself. He said he was hearing voices and knew he needed help so he called 911.   Per H&P, pt with decline in health over the past two months secondary to depression, as she has not been able to obtain his medications. He also complains of decreased appetite. He has history of polysubstance abuse, but is 4 months sober.   No meal completions available to assess at this time.  Wt has been stable for over the past year.   Labs reviewed.   35 y.o. male  Height: Ht Readings from Last 1 Encounters:  10/29/21 5\' 9"  (1.753 m)    Weight: Wt Readings from Last 1 Encounters:  10/29/21 89.4 kg    Weight Hx: Wt Readings from Last 10 Encounters:  10/29/21 89.4 kg  10/28/21 90.7 kg  08/15/20 83.9 kg    BMI:  Body mass index is 29.09 kg/m. BMI WDL.   Estimated Nutritional Needs: Kcal: 25-30 kcal/kg Protein: > 1 gram protein/kg Fluid: 1 ml/kcal  Diet Order:  Diet Order             Diet regular Room service appropriate? Yes; Fluid  consistency: Thin  Diet effective now                  Pt is also offered choice of unit snacks mid-morning and mid-afternoon.  Pt is eating as desired.   Lab results and medications reviewed.   08/17/20, RD, LDN, CDCES Registered Dietitian II Certified Diabetes Care and Education Specialist Please refer to [facility] for RD and/or RD on-call/weekend/after hours pager

## 2021-10-31 NOTE — Group Note (Signed)
LCSW Group Therapy Note   Group Date: 10/31/2021 Start Time: 1300 End Time: 1400   Type of Therapy and Topic:  Group Therapy: Coping Skills  Participation Level:  Active   Due to the acuity and complex discharge plans, group was not held. Patient was provided therapeutic worksheets and asked to meet with CSW as needed.  Felizardo Hoffmann, LCSWA 10/31/2021  1:41 PM

## 2021-10-31 NOTE — Group Note (Signed)
Recreation Therapy Group Note   Group Topic:Stress Management  Group Date: 10/31/2021 Start Time: 0930 End Time: 0950 Facilitators: Caroll Rancher, LRT,CTRS Location: [address] Dayroom   Goal Area(s) Addresses:  Patient will actively participate in stress management techniques presented during session.  Patient will successfully identify benefit of practicing stress management post d/c.   Group Description: Guided Imagery. LRT provided education, instruction, and demonstration on practice of visualization via guided imagery. Patient was asked to participate in the technique introduced during session. LRT debriefed including topics of mindfulness, stress management and specific scenarios each patient could use these techniques. Patients were given suggestions of ways to access scripts post d/c and encouraged to explore Youtube and other apps available on smartphones, tablets, and computers.   Affect/Mood: N/A   Participation Level: Did not attend    Clinical Observations/Individualized Feedback: Pt did not attend group.    Plan: Continue to engage patient in RT group sessions 2-3x/week.   Caroll Rancher, LRT,CTRS 10/31/2021 11:56 AM

## 2021-11-01 DIAGNOSIS — F3130 Bipolar disorder, current episode depressed, mild or moderate severity, unspecified: Secondary | ICD-10-CM

## 2021-11-01 NOTE — [facility] Group Notes (Signed)
1600 - PsychoEducational group- Patients were educated on the power of negative versus positive thinking, and the habits of both and impact on our mental health. Patients were educated on positive re-framing and given examples in day to day life. Pts were given example by showing spaghetti noodles, while together, one thought is not much, over and over becomes more powerful, and harder to break. Patients were then asked to break a noodle and give an example of a negative thought in their life they needed to break free from. Pt participated and was appropriate. 

## 2021-11-01 NOTE — Progress Notes (Signed)
   11/01/21 2241  Psych Admission Type (Psych Patients Only)  Admission Status Voluntary  Psychosocial Assessment  Patient Complaints None  Eye Contact Fair  Facial Expression Flat  Affect Appropriate to circumstance  Speech Logical/coherent  Interaction Assertive  Motor Activity Other (Comment) (utilzing wheelchair)  Appearance/Hygiene In scrubs;Disheveled  Behavior Characteristics Appropriate to situation  Mood Depressed  Thought Process  Coherency WDL  Content WDL  Delusions None reported or observed  Perception WDL  Hallucination None reported or observed  Judgment Poor  Confusion None  Danger to Self  Current suicidal ideation? Denies  Danger to Others  Danger to Others None reported or observed  Danger to Others Abnormal  Harmful Behavior to others No threats or harm toward other people  Destructive Behavior No threats or harm toward property

## 2021-11-01 NOTE — Progress Notes (Signed)
EKG results placed on the outside of pt's shadow chart and Provider made aware   Sinus rhythm with frequent and consecutive premature ventricular complexes  Prolonged QT  Abnormal ECG   QT/Qtc Baz  394/497 ms   Vent rate 96 BPM

## 2021-11-01 NOTE — Group Note (Signed)
Date:  11/01/2021 Time:  10:48 AM  Number of Participants: 10  Group Focus: daily focus Treatment Modality:  Psychoeducation Interventions utilized were group exercise Purpose: express feelings and increase insight     Participation Level:  Did Not Attend
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
## 2021-11-01 NOTE — Progress Notes (Addendum)
[facility] MD Progress Note  11/01/2021 2:41 PM Edward Wolfe  MRN:  [id number] Subjective:  " I have always had my heart skipping beats"  Reason for admission: 35 year old male with a past psychiatric history of bipolar disorder, diagnosed in 2008, who was admitted to the psychiatric unit for evaluation and treatment of worsening depression and suicide attempt by cutting himself on left wrist.   Today's evaluation:  Report received, care reviewed with members of our interdisciplinary team.  Report stated his HR last night dropped to 35 but patient was asymptomatic and denied chest pain.  HR this morning manually was 50.  EKG was done this morning and showed SR with Couplet PVC'.  Dr Anne Fu, Cardiology on call was called and with no return call from him DR Clifton James, Cardiologist-interventionist was called and he answered phone call.  He agreed to a repeat of Magnesium and Potassium this evening and he contacted DR Anne Fu who then called this provider back.  He reviewed the EKG and reviewed old EKG and stated that patient has a hx of Bradycardia and should follow up with outpatient Cardiologist after discharge.  He also agreed to repeat Potassium and Magnesium this evening. Meanwhile patient is alert and oriented x4.  He is using W/C for moving around in the unit due complaints of back pain.  He does not participate in group activities most of the time.  He complains of anxiety however his Sertraline was increased to 75 mg po daily and Gabapentin started for back pain yesterday as well.  He denied feeling suicidal and no self harm behavior noted.  We will continue to monitor and make changes as needed. Principal Problem: Bipolar disorder (HCC) Diagnosis: Principal Problem:   Bipolar disorder (HCC)  Total Time spent with patient: 30 minutes  Past Psychiatric History: See H&P  Past Medical History: History reviewed. No pertinent past medical history. History reviewed. No pertinent surgical history. Family  History: History reviewed. No pertinent family history. Family Psychiatric  History: See H&P Social History:  Social History   Substance and Sexual Activity  Alcohol Use Yes     Social History   Substance and Sexual Activity  Drug Use Yes   Types: Marijuana, Methamphetamines   Comment: methamphatmines last use 3 weeks ago.     Social History   Socioeconomic History   Marital status: Single    Spouse name: Not on file   Number of children: Not on file   Years of education: Not on file   Highest education level: Not on file  Occupational History   Not on file  Tobacco Use   Smoking status: Every Day    Packs/day: 1.00    Types: Cigarettes   Smokeless tobacco: Never  Substance and Sexual Activity   Alcohol use: Yes   Drug use: Yes    Types: Marijuana, Methamphetamines    Comment: methamphatmines last use 3 weeks ago.    Sexual activity: Yes    Birth control/protection: Condom  Other Topics Concern   Not on file  Social History Narrative   Not on file   Social Determinants of Health   Financial Resource Strain: High Risk   Difficulty of Paying Living Expenses: Hard  Food Insecurity: No Food Insecurity   Worried About Running Out of Food in the Last Year: Never true   Ran Out of Food in the Last Year: Never true  Transportation Needs: No Transportation Needs   Lack of Transportation (Medical): No   Lack of Transportation (Non-Medical):  No  Physical Activity: Inactive   Days of Exercise per Week: 0 days   Minutes of Exercise per Session: 0 min  Stress: Stress Concern Present   Feeling of Stress : Rather much  Social Connections: Not on file   Additional Social History:                         Sleep: Poor  Appetite:  Good  Current Medications: Current Facility-Administered Medications  Medication Dose Route Frequency Provider Last Rate Last Admin   acetaminophen (TYLENOL) tablet 650 mg  650 mg Oral Q6H PRN Novella Olive, NP   650 mg at 10/31/21  0931   alum & mag hydroxide-simeth (MAALOX/MYLANTA) 200-200-20 MG/5ML suspension 30 mL  30 mL Oral Q4H PRN Novella Olive, NP       ARIPiprazole (ABILIFY) tablet 5 mg  5 mg Oral Daily Novella Olive, NP   5 mg at 11/01/21 0900   feeding supplement (BOOST / RESOURCE BREEZE) liquid 1 Container  237 mL Oral BID BM Massengill, Harrold Donath, MD   1 Container at 11/01/21 1423   gabapentin (NEURONTIN) capsule 100 mg  100 mg Oral TID Phineas Inches, MD   100 mg at 11/01/21 1423   hydrOXYzine (ATARAX/VISTARIL) tablet 25 mg  25 mg Oral TID PRN Novella Olive, NP       magnesium hydroxide (MILK OF MAGNESIA) suspension 30 mL  30 mL Oral Daily PRN Novella Olive, NP       multivitamin with minerals tablet 1 tablet  1 tablet Oral Daily Massengill, Nathan, MD   1 tablet at 11/01/21 0900   sertraline (ZOLOFT) tablet 75 mg  75 mg Oral Daily Massengill, Harrold Donath, MD   75 mg at 11/01/21 0900   traZODone (DESYREL) tablet 50 mg  50 mg Oral QHS PRN Novella Olive, NP        Lab Results:  No results found for this or any previous visit (from the past 48 hour(s)).   Blood Alcohol level:  Lab Results  Component Value Date   ETH <10 10/28/2021   ETH <10 01/16/2021    Metabolic Disorder Labs: Lab Results  Component Value Date   HGBA1C 5.1 10/30/2021   MPG 99.67 10/30/2021   MPG 105.41 01/16/2021   Lab Results  Component Value Date   PROLACTIN 11.8 01/16/2021   Lab Results  Component Value Date   CHOL 204 (H) 10/30/2021   TRIG 113 10/30/2021   HDL 45 10/30/2021   CHOLHDL 4.5 10/30/2021   VLDL 23 10/30/2021   LDLCALC 136 (H) 10/30/2021   LDLCALC 157 (H) 01/16/2021    Physical Findings: AIMS: Facial and Oral Movements Muscles of Facial Expression: None, normal Lips and Perioral Area: None, normal Jaw: None, normal Tongue: None, normal,Extremity Movements Upper (arms, wrists, hands, fingers): None, normal Lower (legs, knees, ankles, toes): None, normal, Trunk Movements Neck, shoulders, hips: None,  normal, Overall Severity Severity of abnormal movements (highest score from questions above): None, normal Incapacitation due to abnormal movements: None, normal Patient's awareness of abnormal movements (rate only patient's report): No Awareness, Dental Status Current problems with teeth and/or dentures?: No Does patient usually wear dentures?: No  CIWA:    COWS:     Musculoskeletal: Patient laying in bed today.  Did not have for musculoskeletal evaluation.  Uses cane at home to walk.  He is in wheelchair in the hospital.    Psychiatric Specialty Exam:  Presentation  General  Appearance: Disheveled  Eye Contact:Fair  Speech:Normal Rate  Speech Volume:Normal  Handedness:Right   Mood and Affect  Mood:Anxious; Dysphoric  Affect:Congruent   Thought Process  Thought Processes:Coherent; Goal Directed  Descriptions of Associations:Intact  Orientation:Full (Time, Place and Person)  Thought Content:Logical  History of Schizophrenia/Schizoaffective disorder:No  Duration of Psychotic Symptoms:No data recorded Hallucinations:Hallucinations: None  Ideas of Reference:None  Suicidal Thoughts:Suicidal Thoughts: No  Homicidal Thoughts:Homicidal Thoughts: No   Sensorium  Memory:Immediate Good; Recent Good  Judgment:Fair  Insight:Fair   Executive Functions  Concentration:Fair  Attention Span:Fair  Recall:Fair  Fund of Knowledge:Fair  Language:Good   Psychomotor Activity  Psychomotor Activity:Psychomotor Activity: Normal   Assets  Assets:Desire for Improvement; Housing; Resilience   Sleep  Sleep:Sleep: Good    Physical Exam: Physical Exam Vitals reviewed.  Constitutional:      General: He is not in acute distress.    Appearance: He is normal weight. He is not ill-appearing or toxic-appearing.     Comments: Laying in bed.  Pulmonary:     Effort: Pulmonary effort is normal. No respiratory distress.  Neurological:     Mental Status: He is  alert.   Review of Systems  Constitutional:  Negative for chills and fever.  Cardiovascular:  Negative for chest pain and palpitations.  Psychiatric/Behavioral:  Positive for depression. The patient is nervous/anxious.   Blood pressure 132/74, pulse (!) 50, temperature 98.1 F (36.7 C), temperature source Oral, resp. rate 18, height 5\' 9"  (1.753 m), weight 89.4 kg, SpO2 98 %. Body mass index is 29.09 kg/m.  Diagnoses / Active Problems: Bipolar disorder Generalized anxiety disorder Methamphetamine use disorder Tobacco use disorder   PLAN: Safety and Monitoring:             -- Voluntary admission to inpatient psychiatric unit for safety, stabilization and treatment             -- Daily contact with patient to assess and evaluate symptoms and progress in treatment             -- Patient's case to be discussed in multi-disciplinary team meeting             -- Observation Level : q15 minute checks             -- Vital signs:  q12 hours             -- Precautions: suicide, elopement, and assault   2. Psychiatric Diagnoses and Treatment:               Continue Sertraline 75 mg once daily for mood, anxiety Continue Abilify 5 mg once daily for mood stabilization Continue Gabapentin 100 mg 3 times daily, for anxiety and pain   --  The risks/benefits/side-effects/alternatives to this medication were discussed in detail with the patient and time was given for questions. The patient consents to medication trial.                -- Metabolic profile and EKG monitoring obtained while on an atypical antipsychotic (BMI: Lipid Panel: HbgA1c: QTc:) 487 -Monitor bradycardia.  Patient is asymptomatic.             -- Encouraged patient to participate in unit mAbility to identify changes in lifestyle to reduce recurrence of condition will improve, Ability to verbalize feelings will improve, Ability to disclose and discuss suicidal ideas, Ability to demonstrate self-control will improve, Ability to  identify and develop effective coping behaviors will improve, Ability to maintain clinical measurements within normal  limits will improve, Compliance with prescribed medications will improve, and Ability to identify triggers associated with substance abuse/mental health issues will improveilieu and in scheduled group therapies              -- Short Term Goals: Ability to identify changes in lifestyle to reduce recurrence of condition will improve             -- Long Term Goals: Improvement in symptoms so as ready for discharge                3. Medical Issues Being Addressed:              Tobacco Use Disorder             -- Nicotine patch 21mg /24 hours ordered             -- Smoking cessation encouraged   4. Discharge Planning:              -- Social work and case management to assist with discharge planning and identification of hospital follow-up needs prior to discharge             -- Estimated LOS: 5-7 days             -- Discharge Concerns: Need to establish a safety plan; Medication compliance and effectiveness             -- Discharge Goals: Return home with outpatient referrals for mental health follow-up including medication management/psychotherapy Per cardiologist DR 06-08-1985 need outpatient Echocardiogram to evaluate heart Chambers and vessels.  Will need outpatient Cardiology appointment. Camargo Callas, NP-PMHNP-BC 11/01/2021, 2:41 PM
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
## 2021-11-01 NOTE — Progress Notes (Signed)
   11/01/21 1400  Psych Admission Type (Psych Patients Only)  Admission Status Voluntary  Psychosocial Assessment  Patient Complaints None  Eye Contact Brief;Fair  Facial Expression Flat  Affect Appropriate to circumstance  Speech Logical/coherent  Interaction Guarded  Motor Activity Other (Comment) (in wheelchair)  Appearance/Hygiene In scrubs  Behavior Characteristics Cooperative  Mood Depressed  Thought Process  Coherency WDL  Content WDL  Delusions None reported or observed  Perception WDL  Hallucination None reported or observed  Judgment Poor  Confusion None  Danger to Self  Current suicidal ideation? Denies  Danger to Others  Danger to Others None reported or observed  Danger to Others Abnormal  Harmful Behavior to others No threats or harm toward other people  Destructive Behavior No threats or harm toward property

## 2021-11-01 NOTE — Group Note (Signed)
Date:  11/01/2021 Time:  9:21 AM  Group Topic/Focus:  Goals Group:   The focus of this group is to help patients establish daily goals to achieve during treatment and discuss how the patient can incorporate goal setting into their daily lives to aide in recovery. Orientation:   The focus of this group is to educate the patient on the purpose and policies of crisis stabilization and provide a format to answer questions about their admission.  The group details unit policies and expectations of patients while admitted.    Participation Level:  Did Not Attend

## 2021-11-02 LAB — MAGNESIUM: Magnesium: 2 mg/dL (ref 1.7–2.4)

## 2021-11-02 LAB — POTASSIUM: Potassium: 4.4 mmol/L (ref 3.5–5.1)

## 2021-11-02 NOTE — Progress Notes (Addendum)
[facility] MD Progress Note  11/02/2021 11:45 AM Edward Wolfe  MRN:  [id number] Subjective:  " I have always had my heart skipping beats"  Reason for admission: 35 year old male with a past psychiatric history of bipolar disorder, diagnosed in 2008, who was admitted to the psychiatric unit for evaluation and treatment of worsening depression and suicide attempt by cutting himself on left wrist.   Today's evaluation:  Report received, care reviewed with members of our interdisciplinary team.  Report stated his HR early this morning  dropped to 35 but patient was asymptomatic and denied chest pain.  HR this morning manually was 46 this morning.  He denied chest pain and remains asymptomatic.  Magnesium and Potassium level are normal this morning-mag 2 and K 4.4. Patient has ben in his room since morning and missed groups so far.  Patient so far have not needed PRN medications.  He denied SI/HI/AVH and no mention of paranoia. Principal Problem: Bipolar disorder (HCC) Diagnosis: Principal Problem:   Bipolar disorder (HCC)  Total Time spent with patient: 20 minutes  Past Psychiatric History: See H&P  Past Medical History: History reviewed. No pertinent past medical history. History reviewed. No pertinent surgical history. Family History: History reviewed. No pertinent family history. Family Psychiatric  History: See H&P Social History:  Social History   Substance and Sexual Activity  Alcohol Use Yes     Social History   Substance and Sexual Activity  Drug Use Yes   Types: Marijuana, Methamphetamines   Comment: methamphatmines last use 3 weeks ago.     Social History   Socioeconomic History   Marital status: Single    Spouse name: Not on file   Number of children: Not on file   Years of education: Not on file   Highest education level: Not on file  Occupational History   Not on file  Tobacco Use   Smoking status: Every Day    Packs/day: 1.00    Types: Cigarettes   Smokeless  tobacco: Never  Substance and Sexual Activity   Alcohol use: Yes   Drug use: Yes    Types: Marijuana, Methamphetamines    Comment: methamphatmines last use 3 weeks ago.    Sexual activity: Yes    Birth control/protection: Condom  Other Topics Concern   Not on file  Social History Narrative   Not on file   Social Determinants of Health   Financial Resource Strain: High Risk   Difficulty of Paying Living Expenses: Hard  Food Insecurity: No Food Insecurity   Worried About Running Out of Food in the Last Year: Never true   Ran Out of Food in the Last Year: Never true  Transportation Needs: No Transportation Needs   Lack of Transportation (Medical): No   Lack of Transportation (Non-Medical): No  Physical Activity: Inactive   Days of Exercise per Week: 0 days   Minutes of Exercise per Session: 0 min  Stress: Stress Concern Present   Feeling of Stress : Rather much  Social Connections: Not on file   Additional Social History:                         Sleep: Poor  Appetite:  Good  Current Medications: Current Facility-Administered Medications  Medication Dose Route Frequency Provider Last Rate Last Admin   acetaminophen (TYLENOL) tablet 650 mg  650 mg Oral Q6H PRN Novella Olive, NP   650 mg at 10/31/21 0931   alum & mag hydroxide-simeth (  MAALOX/MYLANTA) 200-200-20 MG/5ML suspension 30 mL  30 mL Oral Q4H PRN Novella Olive, NP       ARIPiprazole (ABILIFY) tablet 5 mg  5 mg Oral Daily Novella Olive, NP   5 mg at 11/02/21 0932   feeding supplement (BOOST / RESOURCE BREEZE) liquid 1 Container  237 mL Oral BID BM Massengill, Harrold Donath, MD   1 Container at 11/02/21 0932   gabapentin (NEURONTIN) capsule 100 mg  100 mg Oral TID Phineas Inches, MD   100 mg at 11/02/21 0932   hydrOXYzine (ATARAX/VISTARIL) tablet 25 mg  25 mg Oral TID PRN Novella Olive, NP       magnesium hydroxide (MILK OF MAGNESIA) suspension 30 mL  30 mL Oral Daily PRN Novella Olive, NP       multivitamin  with minerals tablet 1 tablet  1 tablet Oral Daily Massengill, Nathan, MD   1 tablet at 11/02/21 0932   sertraline (ZOLOFT) tablet 75 mg  75 mg Oral Daily Massengill, Harrold Donath, MD   75 mg at 11/02/21 0932   traZODone (DESYREL) tablet 50 mg  50 mg Oral QHS PRN Novella Olive, NP        Lab Results:  Results for orders placed or performed during the hospital encounter of 10/29/21 (from the past 48 hour(s))  Magnesium     Status: None   Collection Time: 11/02/21  7:25 AM  Result Value Ref Range   Magnesium 2.0 1.7 - 2.4 mg/dL    Comment: Performed at [facility], [address], [place], [place] [address]  Potassium     Status: None   Collection Time: 11/02/21  7:25 AM  Result Value Ref Range   Potassium 4.4 3.5 - 5.1 mmol/L    Comment: Performed at [facility], [address], [place], [place] [address]     Blood Alcohol level:  Lab Results  Component Value Date   [facility] <10 10/28/2021   ETH <10 01/16/2021    Metabolic Disorder Labs: Lab Results  Component Value Date   HGBA1C 5.1 10/30/2021   MPG 99.67 10/30/2021   MPG 105.41 01/16/2021   Lab Results  Component Value Date   PROLACTIN 11.8 01/16/2021   Lab Results  Component Value Date   CHOL 204 (H) 10/30/2021   TRIG 113 10/30/2021   HDL 45 10/30/2021   CHOLHDL 4.5 10/30/2021   VLDL 23 10/30/2021   LDLCALC 136 (H) 10/30/2021   LDLCALC 157 (H) 01/16/2021    Physical Findings: AIMS: Facial and Oral Movements Muscles of Facial Expression: None, normal Lips and Perioral Area: None, normal Jaw: None, normal Tongue: None, normal,Extremity Movements Upper (arms, wrists, hands, fingers): None, normal Lower (legs, knees, ankles, toes): None, normal, Trunk Movements Neck, shoulders, hips: None, normal, Overall Severity Severity of abnormal movements (highest score from questions above): None, normal Incapacitation due to abnormal movements: None, normal Patient's awareness of abnormal  movements (rate only patient's report): No Awareness, Dental Status Current problems with teeth and/or dentures?: No Does patient usually wear dentures?: No  CIWA:    COWS:     Musculoskeletal: Patient laying in bed today.  Did not have for musculoskeletal evaluation.  Uses cane at home to walk.  He is in wheelchair in the hospital.    Psychiatric Specialty Exam:  Presentation  General Appearance: Casual  Eye Contact:Fair  Speech:Clear and Coherent; Normal Rate  Speech Volume:Normal  Handedness:Right   Mood and Affect  Mood:Anxious; Dysphoric  Affect:Congruent;  Flat   Thought Process  Thought Processes:Coherent  Descriptions of Associations:Intact  Orientation:Full (Time, Place and Person)  Thought Content:Logical  History of Schizophrenia/Schizoaffective disorder:No  Duration of Psychotic Symptoms:No data recorded Hallucinations:Hallucinations: None  Ideas of Reference:None  Suicidal Thoughts:Suicidal Thoughts: No  Homicidal Thoughts:Homicidal Thoughts: No   Sensorium  Memory:Immediate Good; Recent Good; Remote Good  Judgment:Fair  Insight:Fair   Executive Functions  Concentration:Fair  Attention Span:Fair  Recall:Fair  Fund of Knowledge:Fair  Language:Fair   Psychomotor Activity  Psychomotor Activity:Psychomotor Activity: Normal   Assets  Assets:Communication Skills; Desire for Improvement; Resilience   Sleep  Sleep:No data recorded    Physical Exam: Physical Exam Vitals reviewed.  Constitutional:      General: He is not in acute distress.    Appearance: He is normal weight. He is not ill-appearing or toxic-appearing.     Comments: Laying in bed.  Pulmonary:     Effort: Pulmonary effort is normal. No respiratory distress.  Neurological:     Mental Status: He is alert.   Review of Systems  Constitutional:  Negative for chills and fever.  Cardiovascular:  Negative for chest pain and palpitations.   Psychiatric/Behavioral:  Positive for depression. The patient is nervous/anxious.   Blood pressure 124/78, pulse (!) 46, temperature 97.9 F (36.6 C), temperature source Oral, resp. rate 18, height 5\' 9"  (1.753 m), weight 89.4 kg, SpO2 100 %. Body mass index is 29.09 kg/m.  Diagnoses / Active Problems: Bipolar disorder Generalized anxiety disorder Methamphetamine use disorder Tobacco use disorder   PLAN: Safety and Monitoring:             -- Voluntary admission to inpatient psychiatric unit for safety, stabilization and treatment             -- Daily contact with patient to assess and evaluate symptoms and progress in treatment             -- Patient's case to be discussed in multi-disciplinary team meeting             -- Observation Level : q15 minute checks             -- Vital signs:  q12 hours             -- Precautions: suicide, elopement, and assault   2. Psychiatric Diagnoses and Treatment:               Continue Sertraline 75 mg once daily for mood, anxiety Continue Abilify 5 mg once daily for mood stabilization Continue Gabapentin 100 mg 3 times daily, for anxiety and pain   --  The risks/benefits/side-effects/alternatives to this medication were discussed in detail with the patient and time was given for questions. The patient consents to medication trial.                -- Metabolic profile and EKG monitoring obtained while on an atypical antipsychotic (BMI: Lipid Panel: HbgA1c: QTc:) 487 -Monitor bradycardia.  Patient is asymptomatic.             -- Encouraged patient to participate in unit mAbility to identify changes in lifestyle to reduce recurrence of condition will improve, Ability to verbalize feelings will improve, Ability to disclose and discuss suicidal ideas, Ability to demonstrate self-control will improve, Ability to identify and develop effective coping behaviors will improve, Ability to maintain clinical measurements within normal limits will improve,  Compliance with prescribed medications will improve, and Ability to identify triggers associated with substance abuse/mental health issues  will improveilieu and in scheduled group therapies              -- Short Term Goals: Ability to identify changes in lifestyle to reduce recurrence of condition will improve             -- Long Term Goals: Improvement in symptoms so as ready for discharge                3. Medical Issues Being Addressed:              Tobacco Use Disorder             -- Nicotine patch 21mg /24 hours ordered             -- Smoking cessation encouraged   Asymptomatic bradycardia  -- Per cardiologist DR [birth year]1984 need outpatient Echocardiogram to evaluate heart Chambers and vessels.  Will need outpatient Cardiology appointment.  --HR this morning manually was 46 this morning.  He denied chest pain and remains asymptomatic.  Magnesium and Potassium level are normal this morning-mag 2 and K 4.4.    4. Discharge Planning:              -- Social work and case management to assist with discharge planning and identification of hospital follow-up needs prior to discharge             -- Estimated LOS: 5-7 days             -- Discharge Concerns: Need to establish a safety plan; Medication compliance and effectiveness             -- Discharge Goals: Return home with outpatient referrals for mental health follow-up including medication management/psychotherapy  Kiowa Callas, NP-PMHNP-BC 11/02/2021, 11:45 AM

## 2021-11-02 NOTE — [facility] Group Notes (Signed)
Psychoeducational Group Note  Date:  11/02/2021 Time:  1300-1400   Group Topic/Focus: This is a continuation of the group from Saturday. Pt's have been asked to formulate a list of 30 positives about themselves. This list is to be read 2 times a day for 30 days, looking in a mirror. Changing patterns of negative self talk. Also discussed is the fact that there have been some people who hurt [place] in the past. We keep that memory alive within [place]. Ways to cope with this are discused   Participation Level:  Active  Participation Quality:  Appropriate  Affect:  Appropriate  Cognitive:  Oriented  Insight: Improving  Engagement in Group:  Engaged  Modes of Intervention:  Activity, Discussion, Education, and Support  Additional Comments:  Rates energy at a 9. Left the group.  Dione Housekeeper

## 2021-11-02 NOTE — [facility] Suicide Risk Assessment (Addendum)
[facility] INPATIENT:  Family/Significant Other Suicide Prevention Education  Suicide Prevention Education:  Contact Attempts: Edward Wolfe [phone]), (name of family member/significant other) has been identified by the patient as the family member/significant other with whom the patient will be residing, and identified as the person(s) who will aid the patient in the event of a mental health crisis.  With written consent from the patient, two attempts were made to provide suicide prevention education, prior to and/or following the patient's discharge.  We were unsuccessful in providing suicide prevention education.  A suicide education pamphlet was given to the patient to share with family/significant other.  Date and time of first attempt:  11/02/2021  /  2:50pm - Voicemail could not be left Date and time of second attempt:   11/03/2021  /  11:05am- Voicemail could not be left  Edward Wolfe 11/02/2021, 2:52 PM

## 2021-11-02 NOTE — Group Note (Signed)
[facility] LCSW Group Therapy Note  11/02/2021   10:00-11:00AM  Type of Therapy and Topic:  Group Therapy:  Unhealthy versus Healthy Supports, Which Am I?  Participation Level:  Did Not Attend   Description of Group:  Patients in this group were introduced to the concept that additional supports including self-support are an essential part of recovery.  Initially a discussion was held about the differences between healthy versus unhealthy supports.  Patients were asked to share what unhealthy supports in their lives need to be addressed, as well as what additional healthy supports could be added for greater help in reaching their goals.   A song entitled "My Own Hero" was played and a group discussion ensued in which patients stated they could relate to the song and it inspired them to realize they have be willing to help themselves in order to succeed, because other people cannot achieve sobriety or stability for them.  We discussed adding a variety of healthy supports to address the various needs in patient lives, including becoming more self-supportive.  A song was played called "I Know Where I've Been" toward the end of group and used to conduct an inspirational wrap-up to group of remembering how far they have already come in their journey.  Therapeutic Goals: 1)  Highlight the differences between healthy and unhealthy supports 2)  Suggest the importance of being a part of one's own support system 2)  Discuss reasons people in one's life may eventually be unable to be continually supportive  3)  Identify the patient's current support system   4) Elicit commitments to add healthy supports and to become more conscious of being self-supportive   Summary of Patient Progress:  The patient was invited to group, did not attend.  Therapeutic Modalities:   Motivational Interviewing Activity  Lynnell Chad , MSW, LCSW

## 2021-11-02 NOTE — [facility] Group Notes (Signed)
Adult Psychoeducational Group Not Date:  11/02/2021 Time:  0900-1045 Group Topic/Focus: PROGRESSIVE RELAXATION. A group where deep breathing is taught and tensing and relaxation muscle groups is used. Imagery is used as well.  Pts are asked to imagine 3 pillars that hold them up when they are not able to hold themselves up.  Participation Level:  Did not attend   Dione Housekeeper

## 2021-11-02 NOTE — Progress Notes (Addendum)
   11/02/21 1400  Psych Admission Type (Psych Patients Only)  Admission Status Voluntary  Psychosocial Assessment  Patient Complaints Sleep disturbance  Eye Contact Fair  Facial Expression Flat  Affect Appropriate to circumstance  Speech Logical/coherent  Interaction Assertive  Motor Activity Other (Comment) (utilzing wheelchair)  Appearance/Hygiene In scrubs;Disheveled  Behavior Characteristics Cooperative;Appropriate to situation  Mood Depressed  Thought Process  Coherency WDL  Content WDL  Delusions None reported or observed  Perception WDL  Hallucination None reported or observed  Judgment Poor  Confusion None  Danger to Self  Current suicidal ideation? Denies  Danger to Others  Danger to Others None reported or observed  Danger to Others Abnormal  Harmful Behavior to others No threats or harm toward other people  Destructive Behavior No threats or harm toward property   D. Pt remained in bed for much of the morning, complained of having poor sleep last night. Pt encouraged to ask with for his trazadone tonight, and explained to him that it is ordered  ' as needed' , and would need to let the night nurse know if he needs it.  Pt did attend afternoon group led by RN, and went to the gym with peers for rec therapy. Pt currently denies SI/HI and AVH   A. Labs and vitals monitored. Pt given and educated on medications. Pt supported emotionally and encouraged to express concerns and ask questions.   R. Pt remains safe with 15 minute checks. Will continue POC.

## 2021-11-02 NOTE — [facility] Group Notes (Signed)
Pt didn't attend goals group 

## 2021-11-02 NOTE — [facility] Group Notes (Signed)
[facility] Group Notes:  (Nursing/MHT/Case Management/Adjunct)  Date:  11/02/2021  Time:  8:50 PM  Type of Therapy:   Wrap Up Group  Participation Level:  Active  Participation Quality:  Attentive  Affect:  Appropriate  Cognitive:  Appropriate  Insight:  Appropriate, Good, and Improving  Engagement in Group:  Developing/Improving  Modes of Intervention:  Discussion  Summary of Progress/Problems: PT was in a good mood and participated well in group. PT said he is just glad to be alive and he wants to work on being more grateful.  Lorita Officer 11/02/2021, 8:50 PM

## 2021-11-02 NOTE — Progress Notes (Signed)
   11/02/21 2255  Psych Admission Type (Psych Patients Only)  Admission Status Voluntary  Psychosocial Assessment  Patient Complaints Insomnia  Eye Contact Fair  Facial Expression Animated  Affect Appropriate to circumstance  Speech Logical/coherent  Interaction Assertive  Motor Activity Other (Comment) (Pt is ulitizing wheelchair)  Appearance/Hygiene Disheveled;In scrubs  Behavior Characteristics Appropriate to situation  Mood Pleasant  Thought Process  Coherency WDL  Content WDL  Delusions None reported or observed  Perception WDL  Hallucination None reported or observed  Judgment Poor  Confusion None  Danger to Self  Current suicidal ideation? Denies  Danger to Others  Danger to Others None reported or observed  Danger to Others Abnormal  Harmful Behavior to others No threats or harm toward other people  Destructive Behavior No threats or harm toward property

## 2021-11-03 MED ORDER — NICOTINE 21 MG/24HR TD PT24
21.0000 mg | MEDICATED_PATCH | Freq: Every day | TRANSDERMAL | Status: DC
  Administered 2021-11-03 – 2021-11-04 (×2): 21 mg via TRANSDERMAL
  Filled 2021-11-03 (×4): qty 1

## 2021-11-03 MED ORDER — GABAPENTIN 100 MG PO CAPS
200.0000 mg | ORAL_CAPSULE | Freq: Three times a day (TID) | ORAL | Status: DC
  Administered 2021-11-03 – 2021-11-04 (×3): 200 mg via ORAL
  Filled 2021-11-03 (×8): qty 2

## 2021-11-03 NOTE — Group Note (Signed)
Occupational Therapy Group Note  Group Topic:Feelings Management  Group Date: 11/03/2021 Start Time: 1400 End Time: 1500 Facilitators: Damier Disano, OT/L    Group Description: Group encouraged increased engagement and participation through discussion focused on Building Happiness. Patients were provided a handout and reviewed therapeutic strategies to build happiness including identifying gratitudes, random acts of kindness, exercise, meditation, positive journaling, and fostering relationships. Patients engaged in discussion and encouraged to reflect on each strategy and their experiences.  Therapeutic Goal(s): Identify strategies to build happiness. Identify and implement therapeutic strategies to improve overall mood. Practice and identify gratitudes, random acts of kindness, exercise, meditation, positive journaling, and fostering relationships     Participation Level: Did not attend   Plan: Continue to engage patient in OT groups 2 - 3x/week.  11/03/2021  Edward Wolfe, OT/L 

## 2021-11-03 NOTE — [facility] Group Notes (Signed)
Pt attended goals group and contributed 

## 2021-11-03 NOTE — Progress Notes (Signed)
PATIENT WOULD LIKE TO USE WALKER INSTEAD OF WHEEL CHAIR.  PATIENT STATED 'LEGS ARE STARTING TO FREEZE UP."

## 2021-11-03 NOTE — Progress Notes (Addendum)
[facility] MD Progress Note  11/03/2021 2:54 PM Willford Rabideau  MRN:  [id number] Subjective:  "I am doing good".  Reason for admission: 35 year old male with a past psychiatric history of bipolar disorder, diagnosed in 2008, who was admitted to the psychiatric unit for evaluation and treatment of worsening depression and suicide attempt by cutting himself on left wrist.    Today's evaluation (11/03/21):  Daily notes: Schyler is seen, chart reviewed. The chart findings & treatment plan discussed with the treatment team. Pt is alert and oriented to person, place, time and situation. Patient is visible on the unit, and is able to propel himself around in a wheelchair.  Edmund' mood is euthymic, and congruent with affect. Pt is calm and cooperative. Patient denies suicidal ideations, denies homicidal ideations, and denies auditory/visual hallucinations. Pt denies paranoia, and there is no evidence of delusional thoughts. Pt reports a good appetite, reports a "wonderful" sleep quality, states that he got up just once last night to use the bathroom. Pt able to list his medications as "Zoloft, Abilify, Trazodone, Gabapentin and multivitamins", and states he is tolerating his medications well, and denies having any side effects from these medications.   Terin reports that he is having cravings for cigarettes and "Meth", and adds "I'm dealing with the Meth cravings". Pt agreeable to a Nicotine patch being ordered for the cigarette cravings. 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 Pt also reports having a good support system in his husband with whom he lives, and states both of them use meth and are trying to beat the addiction together.   Principal Problem: Bipolar disorder (HCC) Diagnosis: Principal Problem:   Bipolar disorder (HCC)  Total Time spent with patient: 25 minutes  Past Psychiatric History: See H&P  Past  Medical History: History reviewed. No pertinent past medical history. History reviewed. No pertinent surgical history. Family History: History reviewed. No pertinent family history. Family Psychiatric  History: See H&P Social History:  Social History   Substance and Sexual Activity  Alcohol Use Yes     Social History   Substance and Sexual Activity  Drug Use Yes   Types: Marijuana, Methamphetamines   Comment: methamphatmines last use 3 weeks ago.     Social History   Socioeconomic History   Marital status: Single    Spouse name: Not on file   Number of children: Not on file   Years of education: Not on file   Highest education level: Not on file  Occupational History   Not on file  Tobacco Use   Smoking status: Every Day    Packs/day: 1.00    Types: Cigarettes   Smokeless tobacco: Never  Substance and Sexual Activity   Alcohol use: Yes   Drug use: Yes    Types: Marijuana, Methamphetamines    Comment: methamphatmines last use 3 weeks ago.    Sexual activity: Yes    Birth control/protection: Condom  Other Topics Concern   Not on file  Social History Narrative   Not on file   Social Determinants of Health   Financial Resource Strain: High Risk   Difficulty of Paying Living Expenses: Hard  Food Insecurity: No Food Insecurity   Worried About Running Out of Food in the Last Year: Never true   Ran Out of Food in the Last Year: Never true  Transportation Needs: No Transportation Needs   Lack of Transportation (Medical): No   Lack of  Transportation (Non-Medical): No  Physical Activity: Inactive   Days of Exercise per Week: 0 days   Minutes of Exercise per Session: 0 min  Stress: Stress Concern Present   Feeling of Stress : Rather much  Social Connections: Not on file   Additional Social History:     Sleep: Good  Appetite:  Good  Current Medications: Current Facility-Administered Medications  Medication Dose Route Frequency Provider Last Rate Last Admin    acetaminophen (TYLENOL) tablet 650 mg  650 mg Oral Q6H PRN Novella Olive, NP   650 mg at 10/31/21 0931   alum & mag hydroxide-simeth (MAALOX/MYLANTA) 200-200-20 MG/5ML suspension 30 mL  30 mL Oral Q4H PRN Novella Olive, NP       ARIPiprazole (ABILIFY) tablet 5 mg  5 mg Oral Daily Novella Olive, NP   5 mg at 11/03/21 0900   feeding supplement (BOOST / RESOURCE BREEZE) liquid 1 Container  237 mL Oral BID BM Holger Sokolowski, Harrold Donath, MD   1 Container at 11/03/21 1343   gabapentin (NEURONTIN) capsule 200 mg  200 mg Oral TID Laveda Abbe, NP       hydrOXYzine (ATARAX/VISTARIL) tablet 25 mg  25 mg Oral TID PRN Novella Olive, NP       magnesium hydroxide (MILK OF MAGNESIA) suspension 30 mL  30 mL Oral Daily PRN Novella Olive, NP       multivitamin with minerals tablet 1 tablet  1 tablet Oral Daily Remmy Crass, MD   1 tablet at 11/03/21 0900   nicotine (NICODERM CQ - dosed in mg/24 hours) patch 21 mg  21 mg Transdermal Daily Laveda Abbe, NP   21 mg at 11/03/21 1300   sertraline (ZOLOFT) tablet 75 mg  75 mg Oral Daily Reesha Debes, Harrold Donath, MD   75 mg at 11/03/21 0900   traZODone (DESYREL) tablet 50 mg  50 mg Oral QHS PRN Novella Olive, NP   50 mg at 11/02/21 2104    Lab Results:  Results for orders placed or performed during the hospital encounter of 10/29/21 (from the past 48 hour(s))  Magnesium     Status: None   Collection Time: 11/02/21  7:25 AM  Result Value Ref Range   Magnesium 2.0 1.7 - 2.4 mg/dL    Comment: Performed at [facility], [address], [place], [place] [address]  Potassium     Status: None   Collection Time: 11/02/21  7:25 AM  Result Value Ref Range   Potassium 4.4 3.5 - 5.1 mmol/L    Comment: Performed at [facility], [address], [place], [place] [address]     Blood Alcohol level:  Lab Results  Component Value Date   [facility] <10 10/28/2021   ETH <10 01/16/2021    Metabolic Disorder Labs: Lab Results   Component Value Date   HGBA1C 5.1 10/30/2021   MPG 99.67 10/30/2021   MPG 105.41 01/16/2021   Lab Results  Component Value Date   PROLACTIN 11.8 01/16/2021   Lab Results  Component Value Date   CHOL 204 (H) 10/30/2021   TRIG 113 10/30/2021   HDL 45 10/30/2021   CHOLHDL 4.5 10/30/2021   VLDL 23 10/30/2021   LDLCALC 136 (H) 10/30/2021   LDLCALC 157 (H) 01/16/2021    Physical Findings: AIMS: Facial and Oral Movements Muscles of Facial Expression: None, normal Lips and Perioral Area: None, normal Jaw: None, normal Tongue: None, normal,Extremity Movements Upper (arms, wrists, hands, fingers): None,  normal Lower (legs, knees, ankles, toes): None, normal, Trunk Movements Neck, shoulders, hips: None, normal, Overall Severity Severity of abnormal movements (highest score from questions above): None, normal Incapacitation due to abnormal movements: None, normal Patient's awareness of abnormal movements (rate only patient's report): No Awareness, Dental Status Current problems with teeth and/or dentures?: No Does patient usually wear dentures?: No  CIWA:   N/A COWS:   N/A  Musculoskeletal: Patient laying in bed today.  Did not have for musculoskeletal evaluation.  Uses cane at home to walk.  He is in wheelchair in the hospital.  Psychiatric Specialty Exam:  Presentation  General Appearance: Appropriate for Environment; Disheveled  Eye Contact:Fair  Speech:Clear and Coherent  Speech Volume:Normal  Handedness:Right  Mood and Affect   Mood:Euthymic  Affect:Appropriate  Thought Process  Thought Processes:Coherent  Descriptions of Associations:Intact  Orientation:Full (Time, Place and Person)  Thought Content:Logical  History of Schizophrenia/Schizoaffective disorder:No  Duration of Psychotic Symptoms:No data recorded Hallucinations:Hallucinations: None  Ideas of Reference:None  Suicidal Thoughts:Suicidal Thoughts: No  Homicidal Thoughts:Homicidal  Thoughts: No  Sensorium  Memory:Immediate Good  Judgment:Fair  Insight:Fair  Executive Functions  Concentration:Good  Attention Span:Good  Recall:Good  Fund of Knowledge:Fair  Language:Good  Psychomotor Activity  Psychomotor Activity:Psychomotor Activity: Other (comment) (patient using a wheelchair to self propel around the unit)  Assets  Assets:Social Support; Housing  Sleep  Sleep:Sleep: Good (patient states that he slept all night and only got up once to go use the bathroom)  Physical Exam: Physical Exam Vitals reviewed.  Constitutional:      General: He is not in acute distress.    Appearance: He is normal weight. He is not ill-appearing or toxic-appearing.  HENT:     Nose: Nose normal. No congestion or rhinorrhea.  Pulmonary:     Effort: Pulmonary effort is normal. No respiratory distress.  Musculoskeletal:     Cervical back: No rigidity.  Neurological:     Mental Status: He is alert and oriented to person, place, and time.  Psychiatric:        Mood and Affect: Mood normal.        Behavior: Behavior normal.        Thought Content: Thought content normal.   Review of Systems  Constitutional:  Negative for chills and fever.  HENT:  Negative for congestion and sore throat.   Respiratory:  Negative for cough and shortness of breath.   Cardiovascular:  Negative for chest pain and palpitations.  Gastrointestinal:  Negative for diarrhea and nausea.  Neurological:  Negative for dizziness, tingling, tremors, sensory change, weakness and headaches.  Psychiatric/Behavioral:  Negative for depression (improving on medications), hallucinations, substance abuse and suicidal ideas. The patient is not nervous/anxious.    Blood pressure 108/83, pulse (!) 42, temperature 97.6 F (36.4 C), temperature source Oral, resp. rate 18, height 5\' 9"  (1.753 m), weight 89.4 kg, SpO2 100 %. Body mass index is 29.09 kg/m.  Diagnoses / Active Problems: Bipolar disorder Generalized  anxiety disorder Methamphetamine use disorder Tobacco use disorder   PLAN: Safety and Monitoring: -Continue q15 minute checks -Continue Vital signs:  q12 hours               Psychiatric Diagnoses and Treatment:               -Continue Sertraline 75 mg once daily for mood, anxiety -Continue Abilify 5 mg once daily for mood stabilization -INCREASE Gabapentin to 200 mg 3 times daily for pain -Continue Hydroxyzine/Atarax 25mg  3 times daily PRN  for anxiety -Start Nicotine patch 21mg  transdermal daily for Nicotine replacement  Asymptomatic Bradycardia  As per chart review, Dr Anne Fu from Cardiology has recommended outpatient echocardiogram. HR is currently 42, and patient is asymptomatic.  Discharge Planning in progress             Starleen Blue, NP-PMHNP-BC 11/03/2021, 2:54 PM   Patient ID: Irene Willever, male   DOB: Nov 03, 1986, 35 y.o.   MRN: [id number]  I discussed the case with the APP, and I agree with the assessment and plan of care as documented in the APP's note , as addended by me or notated below:  Agree with plan.    Phineas Inches, MD Psychiatrist

## 2021-11-03 NOTE — [facility] Suicide Risk Assessment (Signed)
[facility] Discharge Suicide Risk Assessment   Principal Problem: Bipolar disorder [facility]) Discharge Diagnoses: Principal Problem:   Bipolar disorder (HCC) Active Problems:   GAD (generalized anxiety disorder)   Methamphetamine use disorder, moderate (HCC)   Tobacco use disorder, moderate, dependence   Total Time spent with patient: 46 minutes  35 year old male with a past psychiatric history of bipolar disorder, diagnosed in 2008, who was admitted to the psychiatric unit for evaluation and treatment of worsening depression and suicide attempt by cutting himself on left wrist.  Patient was medically cleared by outside hospital.  During the patient's hospitalization, patient had extensive initial psychiatric evaluation, and follow-up psychiatric evaluations every day.  Psychiatric diagnoses provided upon initial assessment:  Bipolar disorder Generalized anxiety disorder Methamphetamine use disorder Tobacco use disorder  Patient's psychiatric medications were adjusted on admission:  Start sertraline 50 mg once daily for mood, anxiety Start abilify 5 mg once daily for mood stabilization  During the hospitalization, other adjustments were made to the patient's psychiatric medication regimen:  Abilify was increased to 7.5 mg once daily Sertraline was continued at 50 mg once daily Gabapentin was increased to 200 mg 3 times daily Nicotine replacement therapy was started  Gradually, patient started adjusting to milieu.   Patient's care was discussed during the interdisciplinary team meeting every day during the hospitalization.  The patient denied having side effects to prescribed psychiatric medication.  The patient reports their target psychiatric symptoms of depression, anxiety, and suicidal thoughts, all responded well to the psychiatric medications, and the patient reports overall benefit other psychiatric hospitalization. Supportive psychotherapy was provided to the patient. The patient  also participated in regular group therapy while admitted.   Labs were reviewed with the patient, and abnormal results were discussed with the patient.  Patient has history of bradycardia.  Patient was bradycardic intermittently during hospitalization.  Patient was asymptomatic.  Cardiology was consulted, and they recommended follow-up with outpatient echocardiogram.  The patient denied having suicidal thoughts more than 48 hours prior to discharge.  Patient denies having homicidal thoughts.  Patient denies having auditory hallucinations.  Patient denies any visual hallucinations.  Patient denies having paranoid thoughts.  The patient is able to verbalize their individual safety plan to this provider.  It is recommended to the patient to continue psychiatric medications as prescribed, after discharge from the hospital.    It is recommended to the patient to follow up with your outpatient psychiatric provider and PCP.  Discussed with the patient, the impact of alcohol, drugs, tobacco have been there overall psychiatric and medical wellbeing, and total abstinence from substance use was recommended the patient.    Musculoskeletal: Strength & Muscle Tone: Upper extremity within normal limits Gait & Station: Patient sitting in wheelchair Patient leans: Patient sitting in wheelchair  Psychiatric Specialty Exam  Presentation  General Appearance: Appropriate for Environment; Casual; Fairly Groomed  Eye Contact:Good  Speech:Clear and Coherent; Normal Rate  Speech Volume:Normal  Handedness:Right   Mood and Affect  Mood:Euthymic  Duration of Depression Symptoms: Greater than two weeks  Affect:Appropriate; Congruent; Full Range   Thought Process  Thought Processes:Coherent; Linear  Descriptions of Associations:Intact  Orientation:Full (Time, Place and Person)  Thought Content:Logical  History of Schizophrenia/Schizoaffective disorder:No  Duration of Psychotic Symptoms:No  data recorded Hallucinations:Hallucinations: None  Ideas of Reference:None  Suicidal Thoughts:Suicidal Thoughts: No  Homicidal Thoughts:Homicidal Thoughts: No   Sensorium  Memory:Immediate Good; Recent Good; Remote Good  Judgment:Good  Insight:Good   Executive Functions  Concentration:Good  Attention Span:Good  Recall:Good  Fund of Knowledge:Good  Language:Good   Psychomotor Activity  Psychomotor Activity:Psychomotor Activity: Normal   Assets  Assets:Social Support; Housing   Sleep  Sleep:Sleep: Good   Physical Exam: Physical Exam see discharge summary ROS see discharge summary  Blood pressure 107/73, pulse (!) 42, temperature 97.7 F (36.5 C), temperature source Oral, resp. rate 18, height 5\' 9"  (1.753 m), weight 89.4 kg, SpO2 100 %. Body mass index is 29.09 kg/m.  Mental Status Per Nursing Assessment::   On Admission:  Self-harm thoughts  Demographic factors:  Male Loss Factors:  Loss of significant relationship, Financial problems / change in socioeconomic status, Decline in physical health Historical Factors:  Impulsivity Risk Reduction Factors:  Living with another person, especially a relative  Continued Clinical Symptoms:  Depressive symptoms resolved.  Patient denies having suicidal thoughts.  Cognitive Features That Contribute To Risk:  None    Suicide Risk:  Mild:  There are no identifiable suicide plans, no associated intent, mild dysphoria and related symptoms, good self-control (both objective and subjective assessment), few other risk factors, and identifiable protective factors, including available and accessible social support.   Follow-up Information     [facility] Follow up on 11/06/2021.   Specialty: Behavioral Health Why: You have an assessment appointment for the partial hospitalization program on 11/06/21 at 10:00 am for intensive therapy and medication management services.  This will be a  VIRTUAL appointment. Contact information: [address] [place] [phone]        [facility] Follow up.   Why: You may go to this facility for primary care services. Contact information: [address] [phone] [place] [phone]                Plan Of Care/Follow-up recommendations:   Activity: as tolerated  Diet: heart healthy  Other: -Follow-up with your outpatient psychiatric provider -instructions on appointment date, time, and address (location) are provided to you in discharge paperwork.  -Take your psychiatric medications as prescribed at discharge - instructions are provided to you in the discharge paperwork  -Follow-up with outpatient primary care doctor and other specialists -for management of chronic medical disease, bradycardia, and preventive medicine.  -Testing: Follow-up with outpatient provider for abnormal lab results:  #Bradycardia-follow with outpatient cardiologist, they recommend echocardiogram as outpatient procedure - [facility] is recommended, for PCP and for referral to cardiology.   #Abnormal lipid panel   -Recommend abstinence from alcohol, tobacco, and other illicit drug use at discharge.   -If your psychiatric symptoms recur, worsening, or if you have side effects to your psychiatric medications, call your outpatient psychiatric provider, [phone] or go to the nearest emergency department.  -If suicidal thoughts recur, call your outpatient psychiatric provider, 911, [phone] or go to the nearest emergency department.   [phone], MD 11/04/2021, 10:40 AM

## 2021-11-03 NOTE — BHH Group Notes (Signed)
[facility] Group Notes:  (Nursing/MHT/Case Management/Adjunct)  Date:  11/03/2021  Time:  9:34 PM  Type of Therapy:  Psychoeducational Skills  Participation Level:  Minimal  Participation Quality:  Attentive  Affect:  Appropriate  Cognitive:  Appropriate  Insight:  Appropriate  Engagement in Group:  Engaged  Modes of Intervention:  Education  Summary of Progress/Problems: The patient attended the evening A.A.meeting and was appropriate. He contributed a little bit with the group.  Hazle Coca S 11/03/2021, 9:34 PM

## 2021-11-03 NOTE — Group Note (Signed)
Recreation Therapy Group Note   Group Topic:Stress Management  Group Date: 11/03/2021 Start Time: 0930 End Time: 0945 Facilitators: Caroll Rancher, [place] Location: [address] Dayroom   Goal Area(s) Addresses:  Patient will identify positive stress management techniques. Patient will identify benefits of using stress management post d/c.  Group Description:  Meditation.  LRT played a meditation that focused on self trust and how we sometimes doubt ourselves due to past mistakes and what it takes to regain that confidence/trust in ones self.  Patients were to listen, follow along and let their breathing relax each person as much as possible.   Affect/Mood: Appropriate   Participation Level: Minimal   Participation Quality: Independent   Behavior: Appropriate and Distracted   Speech/Thought Process: Distracted   Insight: Lacking   Judgement: Lacking    Modes of Intervention: Meditation   Patient Response to Interventions:  Disengaged   Education Outcome:  Acknowledges education and In group clarification offered    Clinical Observations/Individualized Feedback: Pt sat quietly throughout session.  Pt was appropriate but focused on the people in the hallway.    Plan: Continue to engage patient in RT group sessions 2-3x/week.   Caroll Rancher, LRT,CTRS 11/03/2021 11:48 AM

## 2021-11-03 NOTE — Plan of Care (Signed)
Nurse discussed anxiety, depression and coping skills with patient.  

## 2021-11-03 NOTE — Progress Notes (Signed)
D:  Patient denied SI and HI, contracts for safety.  Denied A/V hallucinations.  Denied pain. A:  Medications administered per MD orders.  Emotional support and encouragement given patient. R:  Safety maintained with 15 minute checks.  Patient has been in wheelchair today in hallway and dayroom. Patient will discuss with MD using the walker  tomorrow instead of using wheelchair.

## 2021-11-03 NOTE — Group Note (Signed)
LCSW Group Therapy Note   Group Date: 11/03/2021 Start Time: 1300 End Time: 1400  Therapy Type: Group Therapy  Participation Level:  Did Not Attend   Patients received a worksheet with an outline of 2 gingerbread men with a separation in the middle of the page. One sign designated what the pt sees about themselves and the other is what others see. Pts were asked to introduce themselves and share something they like about themself. Pts were then asked to draw, write or color how they view themselves as well as how they are viewed by others. CSW led discussion about the feelings and words associated with each side.   Patient Summary:  Pt did not attend.   Edward Wolfe, LCSWA 11/03/2021  1:18 PM    

## 2021-11-04 DIAGNOSIS — F411 Generalized anxiety disorder: Secondary | ICD-10-CM

## 2021-11-04 DIAGNOSIS — F17200 Nicotine dependence, unspecified, uncomplicated: Secondary | ICD-10-CM

## 2021-11-04 DIAGNOSIS — F1520 Other stimulant dependence, uncomplicated: Secondary | ICD-10-CM

## 2021-11-04 HISTORY — DX: Generalized anxiety disorder: F41.1

## 2021-11-04 MED ORDER — GABAPENTIN 100 MG PO CAPS: 200.0000 mg | ORAL_CAPSULE | Freq: Three times a day (TID) | ORAL | 0 refills | Status: DC

## 2021-11-04 MED ORDER — TRAZODONE HCL 50 MG PO TABS: 50.0000 mg | ORAL_TABLET | Freq: Every evening | ORAL | 0 refills | Status: DC | PRN

## 2021-11-04 MED ORDER — SERTRALINE HCL 25 MG PO TABS: 75.0000 mg | ORAL_TABLET | Freq: Every day | ORAL | 0 refills | Status: DC

## 2021-11-04 MED ORDER — ARIPIPRAZOLE 5 MG PO TABS: 5.0000 mg | ORAL_TABLET | Freq: Every day | ORAL | 0 refills | Status: DC

## 2021-11-04 MED ORDER — HYDROXYZINE HCL 25 MG PO TABS
25.0000 mg | ORAL_TABLET | Freq: Three times a day (TID) | ORAL | 0 refills | Status: DC | PRN
Start: 2021-11-04 — End: 2022-01-02

## 2021-11-04 MED ORDER — HYDROXYZINE HCL 25 MG PO TABS: 25.0000 mg | ORAL_TABLET | Freq: Three times a day (TID) | ORAL | 0 refills | Status: DC | PRN

## 2021-11-04 MED ORDER — ADULT MULTIVITAMIN W/MINERALS CH: 1.0000 | ORAL_TABLET | Freq: Every day | ORAL | Status: DC

## 2021-11-04 MED ORDER — NICOTINE 21 MG/24HR TD PT24
21.0000 mg | MEDICATED_PATCH | Freq: Every day | TRANSDERMAL | 0 refills | Status: DC
Start: 2021-11-05 — End: 2022-10-01
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
## 2021-11-04 NOTE — Progress Notes (Signed)
D:  Patient denied SI and H I, contracts for safety.  Denied A/V hallucinations.  Denied pain. A:  Medications administered per MD orders.  Emotional support and encouragement given patient. R:  Safety maintained with 15 minute checks. Patient using wheelchair in hallway and room today. Patient excited to be discharged today.

## 2021-11-04 NOTE — BHH Group Notes (Signed)
The focus of this group is to help patients establish daily goals to achieve during treatment and discuss how the patient can incorporate goal setting into their daily lives to aide in recovery.  Pt did not attend morning goals group.  

## 2021-11-04 NOTE — Progress Notes (Signed)
Discharge Note:  Patient discharged to his apartment via [employer].  Suicide prevention information given and discussed with patient who stated he understood and had no questions.  Patient stated he appreciated all assistance received from [facility] staff.  Patient stated he received all his belongings, clothing, toiletries, misc items, etc.  Denied SI and HI.  Denied A/V hallucinations.  Discharge information given patient.

## 2021-11-04 NOTE — Plan of Care (Signed)
Nurse discussed coping skills with patient.  

## 2021-11-04 NOTE — Progress Notes (Signed)
  [facility] Adult Case Management Discharge Plan :  Will you be returning to the same living situation after discharge:  Yes,  personal apartment At discharge, do you have transportation home?: No. CSW will arrange Safe Transport Do you have the ability to pay for your medications: Yes,  income  Release of information consent forms completed and in the chart;  Patient's signature needed at discharge.  Patient to Follow up at:  Follow-up Information     [facility] Follow up on 11/06/2021.   Specialty: Behavioral Health Why: You have an assessment appointment for the partial hospitalization program on 11/06/21 at 10:00 am for intensive therapy and medication management services.  This will be a VIRTUAL appointment. Contact information: [address] [place] [address] [phone]                Next level of care provider has access to [facility] Link:yes  Safety Planning and Suicide Prevention discussed: Yes,  w/ pt     Has patient been referred to the Quitline?: Patient refused referral  Patient has been referred for addiction treatment: N/A  Felizardo Hoffmann, LCSWA 11/04/2021, 10:02 AM

## 2021-11-04 NOTE — Progress Notes (Signed)
Pt in stable mood. Pt verbalized being anxious and having trouble sleeping in this facility. Pt states he prefers to use walker because the wheelchair hurts his lower back and makes his leg start cramping. Pt interacting positively with peers.   Pt educated to reposition himself in chair for comfort and reduce pain. Also educated on practicing ROM. Pt given PRN medication for insomnia.   Pt verbalized understanding of safety using wheelchair and reposition himself to reduce discomfort. Medication effective against insomnia.    11/03/21 2115  Psych Admission Type (Psych Patients Only)  Admission Status Voluntary  Psychosocial Assessment  Patient Complaints Anxiety;Depression  Eye Contact Fair  Facial Expression Animated  Affect Appropriate to circumstance  Speech Logical/coherent  Interaction Attention-seeking  Motor Activity Slow;Unsteady  Appearance/Hygiene Disheveled;In scrubs  Behavior Characteristics Anxious;Cooperative  Mood Anxious;Depressed  Thought Process  Coherency WDL  Content WDL  Delusions None reported or observed  Perception WDL  Hallucination None reported or observed  Judgment Poor  Confusion None  Danger to Self  Current suicidal ideation? Denies  Self-Injurious Behavior No self-injurious ideation or behavior indicators observed or expressed   Agreement Not to Harm Self Yes  Description of Agreement Verbal Contract  Danger to Others  Danger to Others None reported or observed  Danger to Others Abnormal  Harmful Behavior to others No threats or harm toward other people  Destructive Behavior No threats or harm toward property

## 2021-11-06 ENCOUNTER — Telehealth (HOSPITAL_COMMUNITY): Payer: Self-pay | Admitting: Professional

## 2021-11-06 ENCOUNTER — Ambulatory Visit (HOSPITAL_COMMUNITY): Payer: No Payment, Other

## 2021-11-09 NOTE — BHH Group Notes (Signed)
Spiritual care group on grief and loss facilitated by chaplain Dyanne Carrel, [facility]   Group Goal:   Support / Education around grief and loss   Members engage in facilitated group support and psycho-social education.   Group Description:   Following introductions and group rules, group members engaged in facilitated group dialog and support around topic of loss, with particular support around experiences of loss in their lives. Group Identified types of loss (relationships / self / things) and identified patterns, circumstances, and changes that precipitate losses. Reflected on thoughts / feelings around loss, normalized grief responses, and recognized variety in grief experience. Group noted Worden's four tasks of grief in discussion.   Group drew on Adlerian / Rogerian, narrative, MI,   Patient Progress: Edward Wolfe attended group and while his verbal participation was minimal, he was engaged in the conversation.  Chaplain Dyanne Carrel, B[facility] Pager, [phone] 4:09 PM

## 2021-11-17 ENCOUNTER — Other Ambulatory Visit: Payer: Self-pay

## 2021-11-17 ENCOUNTER — Telehealth (HOSPITAL_COMMUNITY): Payer: Self-pay | Admitting: Psychiatry

## 2021-11-17 ENCOUNTER — Other Ambulatory Visit (HOSPITAL_COMMUNITY): Payer: Self-pay | Admitting: Psychiatry

## 2021-11-17 MED ORDER — TRAZODONE HCL 50 MG PO TABS
ORAL_TABLET | ORAL | 0 refills | Status: DC
  Filled 2021-11-17: qty 30, 30d supply, fill #0

## 2021-11-17 MED ORDER — ARIPIPRAZOLE 5 MG PO TABS
ORAL_TABLET | ORAL | 0 refills | Status: DC
  Filled 2021-11-17: qty 30, 30d supply, fill #0

## 2021-11-17 MED ORDER — HYDROXYZINE HCL 25 MG PO TABS
ORAL_TABLET | ORAL | 0 refills | Status: DC
  Filled 2021-11-17: qty 30, 10d supply, fill #0

## 2021-11-17 MED ORDER — SERTRALINE HCL 25 MG PO TABS
ORAL_TABLET | ORAL | 0 refills | Status: DC
  Filled 2021-11-17: qty 30, 10d supply, fill #0

## 2021-11-17 MED ORDER — GABAPENTIN 100 MG PO CAPS
ORAL_CAPSULE | ORAL | 0 refills | Status: DC
  Filled 2021-11-17: qty 30, 5d supply, fill #0

## 2021-11-17 MED ORDER — SERTRALINE HCL 25 MG PO TABS
ORAL_TABLET | ORAL | 0 refills | Status: DC
  Filled 2021-11-17: qty 30, fill #0

## 2021-11-17 NOTE — Telephone Encounter (Signed)
Patient called for REFILL:  ZOLOFT Pharmacy :  [facility] Patient phone number: [phone] temporary until his phone is turned back on. Last seen:  10/23/21 COMMENTS:Zoloft prescribed at [facility] 11/04/21

## 2021-11-17 NOTE — Telephone Encounter (Signed)
Medication refilled and sent to preferred pharmacy

## 2021-11-17 NOTE — Telephone Encounter (Signed)
Patient called with temporary phone number due to his number not turned on at this time.  Phone number is significant other, Chrissie Noa, at [phone].

## 2021-11-20 ENCOUNTER — Other Ambulatory Visit (HOSPITAL_COMMUNITY): Payer: Self-pay

## 2021-11-24 ENCOUNTER — Other Ambulatory Visit (HOSPITAL_COMMUNITY): Payer: Self-pay | Admitting: Psychiatry

## 2021-11-24 ENCOUNTER — Telehealth (HOSPITAL_COMMUNITY): Payer: Self-pay | Admitting: Psychiatry

## 2021-11-24 ENCOUNTER — Other Ambulatory Visit: Payer: Self-pay

## 2021-11-24 MED ORDER — SERTRALINE HCL 25 MG PO TABS
ORAL_TABLET | ORAL | 3 refills | Status: DC
Start: 2021-11-24 — End: 2022-11-25
  Filled 2021-11-24 – 2022-02-02 (×5): qty 90, 30d supply, fill #0
  Filled 2022-04-30 – 2022-05-19 (×3): qty 90, 30d supply, fill #1
  Filled 2022-06-24 – 2022-07-30 (×3): qty 90, 30d supply, fill #2
  Filled 2022-09-01: qty 90, 30d supply, fill #3

## 2021-11-24 MED ORDER — GABAPENTIN 100 MG PO CAPS
ORAL_CAPSULE | ORAL | 3 refills | Status: DC
  Filled 2021-11-24 – 2022-02-02 (×5): qty 180, 30d supply, fill #0
  Filled 2022-03-09 – 2022-05-19 (×5): qty 180, 30d supply, fill #1
  Filled 2022-06-18 – 2022-07-30 (×5): qty 180, 30d supply, fill #2
  Filled 2022-09-01: qty 180, 30d supply, fill #3

## 2021-11-24 NOTE — Telephone Encounter (Signed)
Patient is calling, requesting for medications to be refilled correctly. Last refill was only enough for a week or two (refilled for 30 tablets, to be taken 3x daily).  Next appointment 1/31.  Meds to refill:  sertraline (ZOLOFT) 25 MG tablet gabapentin (NEURONTIN) 100 MG capsule

## 2021-11-24 NOTE — Telephone Encounter (Signed)
Medications refilled and sent to preferred pharmacy.

## 2021-11-25 ENCOUNTER — Encounter: Payer: Self-pay | Admitting: Pharmacist

## 2021-12-01 ENCOUNTER — Other Ambulatory Visit: Payer: Self-pay

## 2021-12-02 ENCOUNTER — Other Ambulatory Visit (HOSPITAL_COMMUNITY): Payer: Self-pay

## 2021-12-03 ENCOUNTER — Other Ambulatory Visit: Payer: Self-pay

## 2021-12-03 ENCOUNTER — Encounter: Payer: Self-pay | Admitting: Pharmacist

## 2021-12-04 ENCOUNTER — Other Ambulatory Visit: Payer: Self-pay

## 2021-12-10 ENCOUNTER — Other Ambulatory Visit: Payer: Self-pay

## 2021-12-11 ENCOUNTER — Other Ambulatory Visit: Payer: Self-pay

## 2021-12-29 ENCOUNTER — Other Ambulatory Visit: Payer: Self-pay

## 2022-01-02 ENCOUNTER — Other Ambulatory Visit (HOSPITAL_COMMUNITY): Payer: Self-pay | Admitting: Psychiatry

## 2022-01-02 ENCOUNTER — Other Ambulatory Visit: Payer: Self-pay

## 2022-01-02 DIAGNOSIS — F3130 Bipolar disorder, current episode depressed, mild or moderate severity, unspecified: Secondary | ICD-10-CM

## 2022-01-02 MED ORDER — TRAZODONE HCL 50 MG PO TABS
50.0000 mg | ORAL_TABLET | Freq: Every evening | ORAL | 3 refills | Status: DC | PRN
  Filled 2022-01-02 – 2022-02-02 (×3): qty 30, 30d supply, fill #0
  Filled 2022-04-30 – 2022-05-19 (×2): qty 30, 30d supply, fill #1
  Filled 2022-06-18 – 2022-07-30 (×3): qty 30, 30d supply, fill #2

## 2022-01-02 MED ORDER — HYDROXYZINE HCL 25 MG PO TABS
ORAL_TABLET | ORAL | 0 refills | Status: DC
  Filled 2022-01-02 – 2022-03-09 (×2): qty 30, 10d supply, fill #0
  Filled 2022-05-07: qty 30, 30d supply, fill #0
  Filled 2022-05-19: qty 30, 10d supply, fill #0

## 2022-01-02 MED ORDER — ADULT MULTIVITAMIN W/MINERALS CH
1.0000 | ORAL_TABLET | Freq: Every day | ORAL | 3 refills | Status: DC
Start: 2022-01-02 — End: 2022-10-01
  Filled 2022-01-02: qty 30, 30d supply, fill #0

## 2022-01-02 MED ORDER — ARIPIPRAZOLE 5 MG PO TABS
5.0000 mg | ORAL_TABLET | Freq: Every day | ORAL | 3 refills | Status: DC
  Filled 2022-01-02 – 2022-02-02 (×3): qty 30, 30d supply, fill #0
  Filled 2022-04-30 – 2022-09-01 (×2): qty 30, 30d supply, fill #1

## 2022-01-02 MED ORDER — HYDROXYZINE HCL 25 MG PO TABS
25.0000 mg | ORAL_TABLET | Freq: Three times a day (TID) | ORAL | 3 refills | Status: DC | PRN
  Filled 2022-01-02 – 2022-02-02 (×3): qty 30, 10d supply, fill #0
  Filled 2022-04-30 – 2022-09-01 (×2): qty 30, 10d supply, fill #1
  Filled 2022-09-30: qty 30, 10d supply, fill #2

## 2022-01-02 MED ORDER — TRAZODONE HCL 50 MG PO TABS
50.0000 mg | ORAL_TABLET | Freq: Every day | ORAL | 0 refills | Status: DC
  Filled 2022-01-02: qty 30, fill #0
  Filled 2022-03-09 – 2022-09-30 (×4): qty 30, 30d supply, fill #0

## 2022-01-02 MED ORDER — ARIPIPRAZOLE 5 MG PO TABS
ORAL_TABLET | ORAL | 0 refills | Status: DC
  Filled 2022-01-02 – 2022-05-19 (×4): qty 30, 30d supply, fill #0

## 2022-01-08 ENCOUNTER — Other Ambulatory Visit: Payer: Self-pay

## 2022-01-13 ENCOUNTER — Other Ambulatory Visit: Payer: Self-pay

## 2022-01-13 ENCOUNTER — Emergency Department (HOSPITAL_COMMUNITY)
Admission: EM | Admit: 2022-01-13 | Discharge: 2022-01-13 | Disposition: A | Discharge status: Home / Self Care | Payer: 59 | Attending: Emergency Medicine | Admitting: Emergency Medicine

## 2022-01-13 ENCOUNTER — Emergency Department (HOSPITAL_COMMUNITY): Payer: 59

## 2022-01-13 ENCOUNTER — Encounter (HOSPITAL_COMMUNITY): Payer: Self-pay

## 2022-01-13 DIAGNOSIS — L03116 Cellulitis of left lower limb: Secondary | ICD-10-CM | POA: Diagnosis not present

## 2022-01-13 DIAGNOSIS — R079 Chest pain, unspecified: Secondary | ICD-10-CM | POA: Insufficient documentation

## 2022-01-13 DIAGNOSIS — M7989 Other specified soft tissue disorders: Secondary | ICD-10-CM | POA: Diagnosis present

## 2022-01-13 HISTORY — DX: Bipolar disorder, unspecified: F31.9

## 2022-01-13 LAB — CBC WITH DIFFERENTIAL/PLATELET
Abs Immature Granulocytes: 0.02 10*3/uL (ref 0.00–0.07)
Basophils Absolute: 0 10*3/uL (ref 0.0–0.1)
Basophils Relative: 1 %
Eosinophils Absolute: 0.1 10*3/uL (ref 0.0–0.5)
Eosinophils Relative: 2 %
HCT: 45.9 % (ref 39.0–52.0)
Hemoglobin: 14.6 g/dL (ref 13.0–17.0)
Immature Granulocytes: 0 %
Lymphocytes Relative: 24 %
Lymphs Abs: 2.1 10*3/uL (ref 0.7–4.0)
MCH: 27.5 pg (ref 26.0–34.0)
MCHC: 31.8 g/dL (ref 30.0–36.0)
MCV: 86.4 fL (ref 80.0–100.0)
Monocytes Absolute: 1.2 10*3/uL — ABNORMAL HIGH (ref 0.1–1.0)
Monocytes Relative: 14 %
Neutro Abs: 5.1 10*3/uL (ref 1.7–7.7)
Neutrophils Relative %: 59 %
Platelets: 212 10*3/uL (ref 150–400)
RBC: 5.31 MIL/uL (ref 4.22–5.81)
RDW: 14.2 % (ref 11.5–15.5)
WBC: 8.6 10*3/uL (ref 4.0–10.5)
nRBC: 0 % (ref 0.0–0.2)

## 2022-01-13 LAB — COMPREHENSIVE METABOLIC PANEL
ALT: 30 U/L (ref 0–44)
AST: 28 U/L (ref 15–41)
Albumin: 3.8 g/dL (ref 3.5–5.0)
Alkaline Phosphatase: 64 U/L (ref 38–126)
Anion gap: 8 (ref 5–15)
BUN: 19 mg/dL (ref 6–20)
CO2: 26 mmol/L (ref 22–32)
Calcium: 9.1 mg/dL (ref 8.9–10.3)
Chloride: 106 mmol/L (ref 98–111)
Creatinine, Ser: 0.86 mg/dL (ref 0.61–1.24)
GFR, Estimated: >60 mL/min (ref >60)
Glucose, Bld: 115 mg/dL — ABNORMAL HIGH (ref 70–99)
Potassium: 3.8 mmol/L (ref 3.5–5.1)
Sodium: 140 mmol/L (ref 135–145)
Total Bilirubin: 0.5 mg/dL (ref 0.3–1.2)
Total Protein: 7.7 g/dL (ref 6.5–8.1)

## 2022-01-13 MED ORDER — DOXYCYCLINE HYCLATE 100 MG PO CAPS: 100.0000 mg | ORAL_CAPSULE | Freq: Two times a day (BID) | ORAL | 0 refills | Status: DC

## 2022-01-13 MED ORDER — VANCOMYCIN HCL IN DEXTROSE 1-5 GM/200ML-% IV SOLN
1000.0000 mg | Freq: Once | INTRAVENOUS | Status: AC
Start: 2022-01-13 — End: 2022-01-13
  Administered 2022-01-13: 14:00:00 1000 mg via INTRAVENOUS
  Filled 2022-01-13: qty 200

## 2022-01-13 NOTE — ED Triage Notes (Signed)
Per EMS- Patient c/o lower left leg swelling and 2-3 open lesions to the left lower leg.  Patient states he used Meth IV last night.

## 2022-01-13 NOTE — ED Provider Notes (Addendum)
[facility] DEPT Provider Note   CSN: [id number] Arrival date & time: 01/13/22  1235     History  Chief Complaint  Patient presents with   Leg Swelling   leg lesions    Edward Wolfe is a 36 y.o. male.  Patient complains of pain and swelling to left lower leg.  No other medical problems.  The history is provided by the patient and medical records. No language interpreter was used.  Rash Location: Swelling left leg. Quality: blistering   Severity:  Moderate Onset quality:  Sudden Timing:  Constant Chronicity:  New Context: not animal contact   Relieved by:  Nothing Worsened by:  Nothing Ineffective treatments:  None tried Associated symptoms: no abdominal pain, no diarrhea, no fatigue and no headaches       Home Medications Prior to Admission medications   Medication Sig Start Date End Date Taking? Authorizing Provider  doxycycline (VIBRAMYCIN) 100 MG capsule Take 1 capsule (100 mg total) by mouth 2 (two) times daily. 01/13/22  Yes Milton Ferguson, MD  ARIPiprazole (ABILIFY) 5 MG tablet Take 1 tab by mouth once daily 01/02/22   Eulis Canner E, NP  ARIPiprazole (ABILIFY) 5 MG tablet Take 1 tablet (5 mg total) by mouth daily. 01/02/22   Salley Slaughter, NP  gabapentin (NEURONTIN) 100 MG capsule Take 2 capsules (200 mg total) by mouth 3 (three) times daily. For nerve pain 11/04/21   Nicholes Rough, NP  gabapentin (NEURONTIN) 100 MG capsule Take 2 capsules by mouth 3 times daily for nerve pain 11/24/21   Eulis Canner E, NP  hydrOXYzine (ATARAX) 25 MG tablet Take 1 tab by mouth three times daily as needed for anxiety 01/02/22   Salley Slaughter, NP  hydrOXYzine (ATARAX) 25 MG tablet Take 1 tablet (25 mg total) by mouth 3 (three) times daily as needed for anxiety. For anxiety 01/02/22   Salley Slaughter, NP  Multiple Vitamin (MULTIVITAMIN WITH MINERALS) TABS tablet Take 1 tablet by mouth daily. For nutritional support 01/02/22    Salley Slaughter, NP  nicotine (NICODERM CQ - DOSED IN MG/24 HOURS) 21 mg/24hr patch Place 1 patch (21 mg total) onto the skin daily. For smoking cessation 11/05/21   Nicholes Rough, NP  sertraline (ZOLOFT) 25 MG tablet Take 3 tabs by mouth daily for depression 11/24/21   Eulis Canner E, NP  traZODone (DESYREL) 50 MG tablet Take 1 tab by mouth at bedtime for sleep 01/02/22   Salley Slaughter, NP  traZODone (DESYREL) 50 MG tablet Take 1 tablet (50 mg total) by mouth at bedtime as needed for sleep. For sleep 01/02/22   Salley Slaughter, NP      Allergies    Morphine and related    Review of Systems   Review of Systems  Constitutional:  Negative for appetite change and fatigue.  HENT:  Negative for congestion, ear discharge and sinus pressure.   Eyes:  Negative for discharge.  Respiratory:  Negative for cough.   Cardiovascular:  Negative for chest pain.  Gastrointestinal:  Negative for abdominal pain and diarrhea.  Genitourinary:  Negative for frequency and hematuria.  Musculoskeletal:  Negative for back pain.       Left leg pain  Skin:  Positive for rash.  Neurological:  Negative for seizures and headaches.  Psychiatric/Behavioral:  Negative for hallucinations.    Physical Exam Updated Vital Signs BP 127/68    Pulse 69    Temp 98.4 F (36.9 C) (Oral)  Resp 16    Ht 6' (1.829 m)    Wt 90.7 kg    SpO2 98%    BMI 27.12 kg/m  Physical Exam Vitals and nursing note reviewed.  Constitutional:      Appearance: He is well-developed.  HENT:     Head: Normocephalic.     Nose: Nose normal.  Eyes:     General: No scleral icterus.    Conjunctiva/sclera: Conjunctivae normal.  Neck:     Thyroid: No thyromegaly.  Cardiovascular:     Rate and Rhythm: Normal rate and regular rhythm.     Heart sounds: No murmur heard.   No friction rub. No gallop.  Pulmonary:     Breath sounds: No stridor. No wheezing or rales.  Chest:     Chest wall: No tenderness.  Abdominal:     General:  There is no distension.     Tenderness: There is no abdominal tenderness. There is no rebound.  Musculoskeletal:        General: Normal range of motion.     Cervical back: Neck supple.     Comments: Patient with cellulitis to left lower leg and skin lesions that look like MRSA  Lymphadenopathy:     Cervical: No cervical adenopathy.  Skin:    Findings: No erythema or rash.  Neurological:     Mental Status: He is alert and oriented to person, place, and time.     Motor: No abnormal muscle tone.     Coordination: Coordination normal.  Psychiatric:        Behavior: Behavior normal.    ED Results / Procedures / Treatments   Labs (all labs ordered are listed, but only abnormal results are displayed) Labs Reviewed  COMPREHENSIVE METABOLIC PANEL - Abnormal; Notable for the following components:      Result Value   Glucose, Bld 115 (*)    All other components within normal limits  CBC WITH DIFFERENTIAL/PLATELET - Abnormal; Notable for the following components:   Monocytes Absolute 1.2 (*)    All other components within normal limits    EKG EKG Interpretation  Date/Time:  Tuesday January 13 2022 13:35:20 EST Ventricular Rate:  120 PR Interval:    QRS Duration: 112 QT Interval:  366 QTC Calculation: 473 R Axis:   49 Text Interpretation: Sinus rhythm Paired ventricular premature complexes Incomplete right bundle branch block Borderline ST elevation, inferior leads Baseline wander in lead(s) V5 No significant change since last tracing Confirmed by Calvert Cantor [phone]) on 01/13/2022 1:39:05 PM  Radiology DG Tibia/Fibula Left  Result Date: 01/13/2022 CLINICAL DATA:  Multiple infections. Left leg swelling with 2-3 open lesions. EXAM: LEFT TIBIA AND FIBULA - 2 VIEW COMPARISON:  None. FINDINGS: Normal bone mineralization. The cortices are intact. No radiopaque foreign body. No subcutaneous air. No acute fracture or dislocation. Mild partially visualized chronic spurring at the Achilles  and plantar fascia insertions on the calcaneus. IMPRESSION: No acute fracture or cortical erosion. Electronically Signed   By: Yvonne Kendall M.D.   On: 01/13/2022 14:29    Procedures Procedures    Medications Ordered in ED Medications  vancomycin (VANCOCIN) IVPB 1000 mg/200 mL premix (1,000 mg Intravenous New Bag/Given 01/13/22 1402)    ED Course/ Medical Decision Making/ A&P                           Medical Decision Making Risk Prescription drug management.   Patient has cellulitis left lower leg.  Labs unremarkable.  He is given pain care in the emergency department and will be sent home with doxycycline   This patient presents to the ED for concern of leg swelling, this involves an extensive number of treatment options, and is a complaint that carries with it a high risk of complications and morbidity.  The differential diagnosis includes DVT, cellulitis,   Co morbidities that complicate the patient evaluation  None   Additional history obtained:  Additional history obtained from patient External records from outside source obtained and reviewed including hospital record   Lab Tests:  I Ordered, and personally interpreted labs.  The pertinent results include: CBC chemistries unremarkable   Imaging Studies ordered:  I ordered imaging studies including tib-fib left leg I independently visualized and interpreted imaging which showed unremarkable I agree with the radiologist interpretation   Cardiac Monitoring:  Normal sinus rhythm Medicines ordered and prescription drug management:  I ordered medication including pain for cellulitis Reevaluation of the patient after these medicines showed that the patient stayed the same I have reviewed the patients home medicines and have made adjustments as needed   Test Considered:  Ultrasound leg   Critical Interventions:  None   Consultations Obtained: No consult  Problem List / ED Course: Cellulitis of  leg   Reevaluation:  After the interventions noted above, I reevaluated the patient and found that they have :stayed the same   Social Determinants of Health:  None   Dispostion:  After consideration of the diagnostic results and the patients response to treatment, I feel that the patent would benefit from discharge home with doxycycline and close follow-up.       Final Clinical Impression(s) / ED Diagnoses Final diagnoses:  Cellulitis of left lower extremity    Rx / DC Orders ED Discharge Orders          Ordered    doxycycline (VIBRAMYCIN) 100 MG capsule  2 times daily        01/13/22 1504              Milton Ferguson, MD 01/14/22 [id number]    Milton Ferguson, MD 01/14/22 [phone]

## 2022-01-13 NOTE — ED Provider Triage Note (Signed)
Emergency Medicine Provider Triage Evaluation Note  Stephfon Bovey , a 36 y.o. male  was evaluated in triage.  Pt complains of multiple scabs that appear to be infected on his left lower leg.  Patient states he he knows that one of the wounds is from when he fell try to catch his cat, however the remainder have popped up over the last few days.  He has been using peroxide and triple antibiotic without any relief.  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  He states he most recently used yesterday, however he notes that he usually shoots up in the forearms.  He denies injury to the areas.  Patient also expresses concern for HIV requesting to be tested.  Review of Systems  Positive: Wounds lower leg, some shortness of breath Negative: Chest pain, abdominal pain, nausea /vomiting/diarrhea, fevers  Physical Exam  BP (!) 144/62    Pulse (!) 105    Temp 98.4 F (36.9 C) (Oral)    Resp 18    Ht 6' (1.829 m)    Wt 90.7 kg    SpO2 100%    BMI 27.12 kg/m  Gen:   Awake, no distress   Resp:  Normal effort  MSK:   Moves extremities without difficulty; patient is able to stand.  There 3-4 annular lesions of the lower leg with varying degrees of infection. Other:  Patient's heart rate sounded irregular and fast during auscultation.  Will obtain EKG.  Medical Decision Making  Medically screening exam initiated at 1:25 PM.  Appropriate orders placed.  Demarquis Mera was informed that the remainder of the evaluation will be completed by another provider, this initial triage assessment does not replace that evaluation, and the importance of remaining in the ED until their evaluation is complete.     Janell Quiet, [place] 01/13/22 1329

## 2022-01-13 NOTE — Discharge Instructions (Addendum)
Get rechecked in 2 days for your leg to make sure it is improving.  You can see a family doctor, or an urgent care, or if you cannot get in anywhere you are welcome to come back here for recheck.  Go to the health department if you have any concerns about sexually transmitted diseases

## 2022-01-16 ENCOUNTER — Other Ambulatory Visit: Payer: Self-pay

## 2022-01-19 ENCOUNTER — Other Ambulatory Visit: Payer: Self-pay

## 2022-01-20 ENCOUNTER — Telehealth (HOSPITAL_COMMUNITY): Payer: No Payment, Other | Admitting: Psychiatry

## 2022-01-20 ENCOUNTER — Encounter (HOSPITAL_COMMUNITY): Payer: Self-pay

## 2022-01-26 ENCOUNTER — Other Ambulatory Visit: Payer: Self-pay

## 2022-02-01 ENCOUNTER — Encounter (HOSPITAL_COMMUNITY): Payer: Self-pay | Admitting: Emergency Medicine

## 2022-02-01 ENCOUNTER — Other Ambulatory Visit: Payer: Self-pay

## 2022-02-01 ENCOUNTER — Emergency Department (HOSPITAL_COMMUNITY)
Admission: EM | Admit: 2022-02-01 | Discharge: 2022-02-01 | Disposition: A | Discharge status: Home / Self Care | Payer: 59 | Attending: Emergency Medicine | Admitting: Emergency Medicine

## 2022-02-01 ENCOUNTER — Emergency Department (HOSPITAL_COMMUNITY): Payer: 59

## 2022-02-01 DIAGNOSIS — L03211 Cellulitis of face: Secondary | ICD-10-CM | POA: Insufficient documentation

## 2022-02-01 DIAGNOSIS — K047 Periapical abscess without sinus: Secondary | ICD-10-CM | POA: Diagnosis not present

## 2022-02-01 DIAGNOSIS — K029 Dental caries, unspecified: Secondary | ICD-10-CM | POA: Diagnosis not present

## 2022-02-01 DIAGNOSIS — R220 Localized swelling, mass and lump, head: Secondary | ICD-10-CM | POA: Diagnosis present

## 2022-02-01 DIAGNOSIS — K0889 Other specified disorders of teeth and supporting structures: Secondary | ICD-10-CM

## 2022-02-01 DIAGNOSIS — D72829 Elevated white blood cell count, unspecified: Secondary | ICD-10-CM | POA: Insufficient documentation

## 2022-02-01 LAB — CBC WITH DIFFERENTIAL/PLATELET
Abs Immature Granulocytes: 0.03 10*3/uL (ref 0.00–0.07)
Basophils Absolute: 0 10*3/uL (ref 0.0–0.1)
Basophils Relative: 0 %
Eosinophils Absolute: 0.1 10*3/uL (ref 0.0–0.5)
Eosinophils Relative: 1 %
HCT: 49.1 % (ref 39.0–52.0)
Hemoglobin: 16 g/dL (ref 13.0–17.0)
Immature Granulocytes: 0 %
Lymphocytes Relative: 18 %
Lymphs Abs: 2.1 10*3/uL (ref 0.7–4.0)
MCH: 27.3 pg (ref 26.0–34.0)
MCHC: 32.6 g/dL (ref 30.0–36.0)
MCV: 83.8 fL (ref 80.0–100.0)
Monocytes Absolute: 1.2 10*3/uL — ABNORMAL HIGH (ref 0.1–1.0)
Monocytes Relative: 11 %
Neutro Abs: 8.1 10*3/uL — ABNORMAL HIGH (ref 1.7–7.7)
Neutrophils Relative %: 70 %
Platelets: 210 10*3/uL (ref 150–400)
RBC: 5.86 MIL/uL — ABNORMAL HIGH (ref 4.22–5.81)
RDW: 13.2 % (ref 11.5–15.5)
WBC: 11.5 10*3/uL — ABNORMAL HIGH (ref 4.0–10.5)
nRBC: 0 % (ref 0.0–0.2)

## 2022-02-01 LAB — COMPREHENSIVE METABOLIC PANEL
ALT: 24 U/L (ref 0–44)
AST: 20 U/L (ref 15–41)
Albumin: 4.1 g/dL (ref 3.5–5.0)
Alkaline Phosphatase: 77 U/L (ref 38–126)
Anion gap: 7 (ref 5–15)
BUN: 11 mg/dL (ref 6–20)
CO2: 28 mmol/L (ref 22–32)
Calcium: 9.1 mg/dL (ref 8.9–10.3)
Chloride: 104 mmol/L (ref 98–111)
Creatinine, Ser: 0.81 mg/dL (ref 0.61–1.24)
GFR, Estimated: >60 mL/min (ref >60)
Glucose, Bld: 97 mg/dL (ref 70–99)
Potassium: 4.3 mmol/L (ref 3.5–5.1)
Sodium: 139 mmol/L (ref 135–145)
Total Bilirubin: 0.2 mg/dL — ABNORMAL LOW (ref 0.3–1.2)
Total Protein: 7.8 g/dL (ref 6.5–8.1)

## 2022-02-01 LAB — LACTIC ACID, PLASMA: Lactic Acid, Venous: 1.4 mmol/L (ref 0.5–1.9)

## 2022-02-01 MED ORDER — CLINDAMYCIN PHOSPHATE 600 MG/50ML IV SOLN
600.0000 mg | Freq: Once | INTRAVENOUS | Status: AC
Start: 2022-02-01 — End: 2022-02-01
  Administered 2022-02-01: 600 mg via INTRAVENOUS
  Filled 2022-02-01: qty 50

## 2022-02-01 MED ORDER — IOHEXOL 300 MG/ML  SOLN
75.0000 mL | Freq: Once | INTRAMUSCULAR | Status: AC | PRN
  Administered 2022-02-01: 75 mL via INTRAVENOUS

## 2022-02-01 MED ORDER — SODIUM CHLORIDE (PF) 0.9 % IJ SOLN
INTRAMUSCULAR | Status: AC
  Filled 2022-02-01: qty 50

## 2022-02-01 MED ORDER — CLINDAMYCIN HCL 150 MG PO CAPS
300.0000 mg | ORAL_CAPSULE | Freq: Four times a day (QID) | ORAL | 0 refills | Status: AC
  Filled 2022-02-01: qty 80, 10d supply, fill #0

## 2022-02-01 NOTE — ED Provider Notes (Signed)
[facility]-EMERGENCY DEPT Provider Note   CSN: [id number] Arrival date & time: 02/01/22  1006     History  Chief Complaint  Patient presents with   Dental Pain    Edward Wolfe is a 36 y.o. male.  Patient endorses history of IV methamphetamine use.  Patient presents the emergency department with left-sided upper dental pain as well as dental abscess.  He states that 2 days ago he started noticing the symptoms.  This morning he started noticing his left face swelling and having some inferior orbital edema and erythema.  He denies any fevers or chills.  He has had this happen 1 time in August 2021 where he was diagnosed with facial cellulitis and a dental abscess.  He was seen at [facility] at this time where he had a CT maxillofacial done and incision and drainage of his dental abscess.   Dental Pain Associated symptoms: facial swelling   Associated symptoms: no fever       Home Medications Prior to Admission medications   Medication Sig Start Date End Date Taking? Authorizing Provider  clindamycin (CLEOCIN) 150 MG capsule Take 2 capsules (300 mg total) by mouth every 6 (six) hours for 10 days. 02/01/22 02/11/22 Yes Damari Suastegui, Finis Bud, PA-C  ARIPiprazole (ABILIFY) 5 MG tablet Take 1 tab by mouth once daily 01/02/22   Toy Cookey E, NP  ARIPiprazole (ABILIFY) 5 MG tablet Take 1 tablet (5 mg total) by mouth daily. 01/02/22   Shanna Cisco, NP  doxycycline (VIBRAMYCIN) 100 MG capsule Take 1 capsule (100 mg total) by mouth 2 (two) times daily. 01/13/22   Bethann Berkshire, MD  gabapentin (NEURONTIN) 100 MG capsule Take 2 capsules (200 mg total) by mouth 3 (three) times daily. For nerve pain 11/04/21   Starleen Blue, NP  gabapentin (NEURONTIN) 100 MG capsule Take 2 capsules by mouth 3 times daily for nerve pain 11/24/21   Toy Cookey E, NP  hydrOXYzine (ATARAX) 25 MG tablet Take 1 tab by mouth three times daily as needed for anxiety 01/02/22    Shanna Cisco, NP  hydrOXYzine (ATARAX) 25 MG tablet Take 1 tablet (25 mg total) by mouth 3 (three) times daily as needed for anxiety. For anxiety 01/02/22   Shanna Cisco, NP  Multiple Vitamin (MULTIVITAMIN WITH MINERALS) TABS tablet Take 1 tablet by mouth daily. For nutritional support 01/02/22   Shanna Cisco, NP  nicotine (NICODERM CQ - DOSED IN MG/24 HOURS) 21 mg/24hr patch Place 1 patch (21 mg total) onto the skin daily. For smoking cessation 11/05/21   Starleen Blue, NP  sertraline (ZOLOFT) 25 MG tablet Take 3 tabs by mouth daily for depression 11/24/21   Toy Cookey E, NP  traZODone (DESYREL) 50 MG tablet Take 1 tab by mouth at bedtime for sleep 01/02/22   Shanna Cisco, NP  traZODone (DESYREL) 50 MG tablet Take 1 tablet (50 mg total) by mouth at bedtime as needed for sleep. For sleep 01/02/22   Shanna Cisco, NP      Allergies    Morphine and related    Review of Systems   Review of Systems  Constitutional:  Negative for chills and fever.  HENT:  Positive for dental problem and facial swelling.   Eyes:        Left inferior eye edema and erythema  All other systems reviewed and are negative.  Physical Exam Updated Vital Signs BP 112/62    Pulse 65  Temp 98.5 F (36.9 C) (Oral)    Resp 16    SpO2 96%  Physical Exam Vitals and nursing note reviewed.  Constitutional:      General: He is not in acute distress.    Appearance: Normal appearance. He is well-developed. He is not ill-appearing, toxic-appearing or diaphoretic.  HENT:     Head: Normocephalic and atraumatic.     Jaw: No trismus, tenderness, swelling or pain on movement.      Nose: No nasal deformity.     Mouth/Throat:     Lips: Pink. No lesions.     Dentition: Abnormal dentition. Dental tenderness, dental caries and dental abscesses present.     Comments: Severely decayed dentition. There are multiple missing teeth. Superior most posterior tooth is cracked with tender to the touch. I  can palpate a periapical abscess with no visible drainage. Eyes:     General: Gaze aligned appropriately. No scleral icterus.       Right eye: No discharge.        Left eye: No discharge.     Conjunctiva/sclera: Conjunctivae normal.     Right eye: Right conjunctiva is not injected. No exudate or hemorrhage.    Left eye: Left conjunctiva is not injected. No exudate or hemorrhage. Pulmonary:     Effort: Pulmonary effort is normal. No respiratory distress.  Skin:    General: Skin is warm and dry.  Neurological:     Mental Status: He is alert and oriented to person, place, and time.  Psychiatric:        Mood and Affect: Mood normal.        Speech: Speech normal.        Behavior: Behavior normal. Behavior is cooperative.     ED Results / Procedures / Treatments   Labs (all labs ordered are listed, but only abnormal results are displayed) Labs Reviewed  CBC WITH DIFFERENTIAL/PLATELET - Abnormal; Notable for the following components:      Result Value   WBC 11.5 (*)    RBC 5.86 (*)    Neutro Abs 8.1 (*)    Monocytes Absolute 1.2 (*)    All other components within normal limits  COMPREHENSIVE METABOLIC PANEL - Abnormal; Notable for the following components:   Total Bilirubin 0.2 (*)    All other components within normal limits  LACTIC ACID, PLASMA  LACTIC ACID, PLASMA    EKG None  Radiology CT Maxillofacial W Contrast  Result Date: 02/01/2022 CLINICAL DATA:  Left-sided facial swelling.  Dental abscess. EXAM: CT MAXILLOFACIAL WITH CONTRAST TECHNIQUE: Multidetector CT imaging of the maxillofacial structures was performed with intravenous contrast. Multiplanar CT image reconstructions were also generated. RADIATION DOSE REDUCTION: This exam was performed according to the departmental dose-optimization program which includes automated exposure control, adjustment of the mA and/or kV according to patient size and/or use of iterative reconstruction technique. CONTRAST:  75mL OMNIPAQUE  IOHEXOL 300 MG/ML  SOLN COMPARISON:  Maxillofacial CT 08/15/2020 FINDINGS: Osseous: No acute fracture. Numerous dental caries and periapical lucencies which have progressed from the prior study including prominent lucency associated with the left maxillary premolar teeth with buccal cortex disruption and extensive inflammation in the overlying soft tissues lateral to the maxilla with extension inferiorly in the face as well as anterior to the maxilla and into the inferior left periorbital soft tissues. No drainable abscess. Orbits: No evidence of postseptal cellulitis. Sinuses: Minimal mucosal thickening in the left maxillary sinus. Prior left mastoidectomy. Soft tissues: Mildly enlarged left level II  lymph nodes measuring up to 1.2 cm in short axis, likely reactive. Limited intracranial: Unremarkable. IMPRESSION: 1. Left facial cellulitis without evidence of an abscess. 2. Extensive, progressive dental disease. Electronically Signed   By: Sebastian Ache M.D.   On: 02/01/2022 13:45    Procedures Procedures   Medications Ordered in ED Medications  sodium chloride (PF) 0.9 % injection (has no administration in time range)  clindamycin (CLEOCIN) IVPB 600 mg (600 mg Intravenous New Bag/Given 02/01/22 1138)  iohexol (OMNIPAQUE) 300 MG/ML solution 75 mL (75 mLs Intravenous Contrast Given 02/01/22 1310)    ED Course/ Medical Decision Making/ A&P Clinical Course as of 02/01/22 1426  Sun Feb 01, 2022  1201 CBC with trace leukocytosis and left shift. No anemia or thrombocytopenia. Leukocytosis is minimally elevated and is likely nonspecific [GL]  1337 CMP unremarkable Lactic Acid normal. No need for repeat  [GL]    Clinical Course User Index [GL] Claudie Leach, PA-C                           Medical Decision Making Problems Addressed: Cellulitis of face: complicated acute illness or injury Pain, dental: complicated acute illness or injury  Amount and/or Complexity of Data Reviewed External Data  Reviewed: labs, radiology and notes.    Details: Reviewed previous labs, imaging, and notes from similar presentation in 2021. Labs: ordered. Decision-making details documented in ED Course. Radiology: ordered and independent interpretation performed. Decision-making details documented in ED Course.  Risk Prescription drug management.   This is a 36 y.o. male with a PMH of IV methamphetamine use who presents to the ED with left dental pain, abscess, and facial swelling. He is afebrile. Vitals stable. Exam notable for very poor and decayed dentition. He has a cracked superior posterior tooth with visible pulp. I can also palpate a periapical abscess in the gumlines near by. No visible pus or drainage. He has associated left facial swelling and cellulitic features.  I reviewed past records and this patient was seen at [facility] back in 2021 for similar symptoms on the left side of his mouth. CT maxillofacial was done. He was diagnosed with facial cellulitis secondary to a dental abscess. The abscess was drained at that time and he was placed on antibiotics.   Plan to proceed with obtaining labs, IV clinda, and CT maxillofacial.   I personally reviewed all laboratory work and imaging. Abnormal results outlined below. CBC has trace leukocytosis with minimal left shift.  There is no anemia or thrombocytopenia Pedia.  Leukocytosis might be elevated due to infectious or inflammatory process, however overall non-specific.  CMP is reassuring.  Lactic acid is normal. CT Maxillofacial IMPRESSION:  1. Left facial cellulitis without evidence of an abscess.  2. Extensive, progressive dental disease.    Impression: CT is without evidence of drainable abscess.  He does have left facial cellulitis present.  Labs do not indicate that patient is septic or having severe infection at this time.  Plan to give patient an oral trial outpatient with clindamycin.  He is to follow-up with a dentist regarding  his dental pain as this may or may not be contributing to the cellulitis.  Recommend cessation of IV methamphetamines as well.  Portions of this note were generated with Scientist, clinical (histocompatibility and immunogenetics). Dictation errors may occur despite best attempts at proofreading.   Final Clinical Impression(s) / ED Diagnoses Final diagnoses:  Cellulitis of face  Pain, dental  Rx / DC Orders ED Discharge Orders          Ordered    clindamycin (CLEOCIN) 150 MG capsule  Every 6 hours        02/01/22 1419              Shannel Zahm, Finis Bud, PA-C 02/01/22 1427    Jacalyn Lefevre, MD 02/01/22 1545

## 2022-02-01 NOTE — ED Triage Notes (Signed)
Pt BIBA Per EMS: Pt coming from home w/ c/o dental abscess. Left side of face swollen, left eye swollen shut.  Vitals WDL

## 2022-02-01 NOTE — Discharge Instructions (Signed)
You have been diagnosed with cellulitis of face. I have prescribed you antibiotics that you should take as prescribed. You need to follow up with a dentist. You can either call Dr. Leanord Asal or any of the dental locations that are in the resource guide provided to you. If you develop worsening symptoms, visual changes, or fevers please return to the ED.

## 2022-02-02 ENCOUNTER — Other Ambulatory Visit: Payer: Self-pay

## 2022-03-09 ENCOUNTER — Other Ambulatory Visit: Payer: Self-pay

## 2022-03-16 ENCOUNTER — Other Ambulatory Visit: Payer: Self-pay

## 2022-04-30 ENCOUNTER — Other Ambulatory Visit: Payer: Self-pay

## 2022-05-07 ENCOUNTER — Other Ambulatory Visit: Payer: Self-pay

## 2022-05-14 ENCOUNTER — Other Ambulatory Visit: Payer: Self-pay

## 2022-05-19 ENCOUNTER — Other Ambulatory Visit: Payer: Self-pay

## 2022-05-26 ENCOUNTER — Encounter: Payer: Self-pay | Admitting: Pharmacist

## 2022-05-26 ENCOUNTER — Other Ambulatory Visit (HOSPITAL_COMMUNITY): Payer: Self-pay

## 2022-05-29 ENCOUNTER — Telehealth (HOSPITAL_COMMUNITY): Payer: No Payment, Other | Admitting: Psychiatry

## 2022-05-29 ENCOUNTER — Encounter (HOSPITAL_COMMUNITY): Payer: Self-pay

## 2022-06-02 ENCOUNTER — Other Ambulatory Visit (HOSPITAL_COMMUNITY): Payer: Self-pay

## 2022-06-03 ENCOUNTER — Encounter: Payer: Self-pay | Admitting: Pharmacist

## 2022-06-04 ENCOUNTER — Emergency Department (HOSPITAL_COMMUNITY)
Admission: EM | Admit: 2022-06-04 | Discharge: 2022-06-04 | Disposition: A | Discharge status: Home / Self Care | Payer: Self-pay | Attending: Emergency Medicine | Admitting: Emergency Medicine

## 2022-06-04 ENCOUNTER — Other Ambulatory Visit: Payer: Self-pay

## 2022-06-04 ENCOUNTER — Emergency Department (HOSPITAL_COMMUNITY): Payer: Self-pay

## 2022-06-04 ENCOUNTER — Encounter (HOSPITAL_COMMUNITY): Payer: Self-pay

## 2022-06-04 DIAGNOSIS — Z5900 Homelessness unspecified: Secondary | ICD-10-CM | POA: Insufficient documentation

## 2022-06-04 DIAGNOSIS — W19XXXA Unspecified fall, initial encounter: Secondary | ICD-10-CM

## 2022-06-04 DIAGNOSIS — W01198A Fall on same level from slipping, tripping and stumbling with subsequent striking against other object, initial encounter: Secondary | ICD-10-CM | POA: Insufficient documentation

## 2022-06-04 LAB — RAPID URINE DRUG SCREEN, HOSP PERFORMED
Amphetamines: NOT DETECTED
Barbiturates: NOT DETECTED
Benzodiazepines: NOT DETECTED
Cocaine: NOT DETECTED
Opiates: NOT DETECTED
Tetrahydrocannabinol: NOT DETECTED

## 2022-06-04 LAB — CBC WITH DIFFERENTIAL/PLATELET
Abs Immature Granulocytes: 0.02 10*3/uL (ref 0.00–0.07)
Basophils Absolute: 0 10*3/uL (ref 0.0–0.1)
Basophils Relative: 0 %
Eosinophils Absolute: 0.1 10*3/uL (ref 0.0–0.5)
Eosinophils Relative: 1 %
HCT: 47.7 % (ref 39.0–52.0)
Hemoglobin: 15.7 g/dL (ref 13.0–17.0)
Immature Granulocytes: 0 %
Lymphocytes Relative: 27 %
Lymphs Abs: 2 10*3/uL (ref 0.7–4.0)
MCH: 27.9 pg (ref 26.0–34.0)
MCHC: 32.9 g/dL (ref 30.0–36.0)
MCV: 84.7 fL (ref 80.0–100.0)
Monocytes Absolute: 0.6 10*3/uL (ref 0.1–1.0)
Monocytes Relative: 8 %
Neutro Abs: 4.6 10*3/uL (ref 1.7–7.7)
Neutrophils Relative %: 64 %
Platelets: 204 10*3/uL (ref 150–400)
RBC: 5.63 MIL/uL (ref 4.22–5.81)
RDW: 14.1 % (ref 11.5–15.5)
WBC: 7.3 10*3/uL (ref 4.0–10.5)
nRBC: 0 % (ref 0.0–0.2)

## 2022-06-04 LAB — URINALYSIS, ROUTINE W REFLEX MICROSCOPIC
Bilirubin Urine: NEGATIVE
Glucose, UA: NEGATIVE mg/dL
Hgb urine dipstick: NEGATIVE
Ketones, ur: NEGATIVE mg/dL
Leukocytes,Ua: NEGATIVE
Nitrite: NEGATIVE
Protein, ur: NEGATIVE mg/dL
Specific Gravity, Urine: 1.018 (ref 1.005–1.030)
pH: 5 (ref 5.0–8.0)

## 2022-06-04 LAB — BASIC METABOLIC PANEL
Anion gap: 8 (ref 5–15)
BUN: 16 mg/dL (ref 6–20)
CO2: 25 mmol/L (ref 22–32)
Calcium: 9.2 mg/dL (ref 8.9–10.3)
Chloride: 107 mmol/L (ref 98–111)
Creatinine, Ser: 0.93 mg/dL (ref 0.61–1.24)
GFR, Estimated: >60 mL/min (ref >60)
Glucose, Bld: 107 mg/dL — ABNORMAL HIGH (ref 70–99)
Potassium: 4.2 mmol/L (ref 3.5–5.1)
Sodium: 140 mmol/L (ref 135–145)

## 2022-06-04 LAB — SEDIMENTATION RATE: Sed Rate: 3 mm/hr (ref 0–16)

## 2022-06-04 LAB — MAGNESIUM: Magnesium: 2.3 mg/dL (ref 1.7–2.4)

## 2022-06-04 LAB — C-REACTIVE PROTEIN: CRP: 1.1 mg/dL — ABNORMAL HIGH (ref <1.0)

## 2022-06-04 LAB — HIV ANTIBODY (ROUTINE TESTING W REFLEX): HIV Screen 4th Generation wRfx: NONREACTIVE

## 2022-06-04 MED ORDER — GADOBUTROL 1 MMOL/ML IV SOLN
9.5000 mL | Freq: Once | INTRAVENOUS | Status: AC | PRN
  Administered 2022-06-04: 9.5 mL via INTRAVENOUS

## 2022-06-04 NOTE — Discharge Instructions (Signed)
Please call the case manager number above tomorrow to coordinate getting you wheelchair.
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
## 2022-06-04 NOTE — ED Notes (Signed)
All discharge instructions reviewed with patient and patient verbalized understanding of same. Patient aware to call tomorrow to obtain wheel chair from case management. Patient stable at time of discharge and wheel chair provided to patient prior to leaving department.

## 2022-06-04 NOTE — ED Provider Notes (Signed)
Emergency Department Provider Note   I have reviewed the triage vital signs and the nursing notes.   HISTORY  Chief Complaint Fall   HPI Edward Wolfe is a 36 y.o. male past medical history reviewed below presents emergency department for evaluation of multiple falls today.  Patient tells me that he often walks with a cane and has issues with his legs "giving out."  He notes this has been an ongoing issue for several months but had 2 falls today which prompted him to seek ED care.  He is currently experiencing homelessness.  He denies any head trauma.  He notes that he does use methamphetamine and has used IV drugs 3 months prior.  He continues to use meth but does not inject any longer. No fever. No midline back pain. No numbness in the legs. No syncope.  He is also requesting HIV testing.  He is currently with his fiance who is HIV positive.  His fiance is taking retroviral therapy and per patient report has an undetectable viral load.    Past Medical History:  Diagnosis Date   Bipolar 1 disorder (HCC)     Review of Systems  Constitutional: No fever/chills Eyes: No visual changes. ENT: No sore throat. Cardiovascular: Denies chest pain. Respiratory: Denies shortness of breath. Gastrointestinal: No abdominal pain.  No nausea, no vomiting.  No diarrhea.  No constipation. Genitourinary: Negative for dysuria. Musculoskeletal: Negative for back pain. Positive falls and leg pain.  Skin: Negative for rash. Neurological: Negative for headaches, focal weakness or numbness.   ____________________________________________   PHYSICAL EXAM:  VITAL SIGNS: ED Triage Vitals  Enc Vitals Group     BP 06/04/22 1126 123/73     Pulse Rate 06/04/22 1126 (!) 36     Resp 06/04/22 1126 14     Temp 06/04/22 1126 98.6 F (37 C)     Temp Source 06/04/22 1126 Oral     SpO2 06/04/22 1126 98 %     Weight 06/04/22 1123 200 lb (90.7 kg)     Height 06/04/22 1123 6' (1.829 m)    Constitutional: Alert and oriented. Well appearing and in no acute distress. Eyes: Conjunctivae are normal.  Head: Atraumatic. Nose: No congestion/rhinnorhea. Mouth/Throat: Mucous membranes are moist.   Neck: No stridor.   Cardiovascular: Normal rate, regular rhythm. Good peripheral circulation. Grossly normal heart sounds.   Respiratory: Normal respiratory effort.  No retractions. Lungs CTAB. Gastrointestinal: Soft and nontender. No distention.  Musculoskeletal: No lower extremity tenderness nor edema. No gross deformities of extremities. Neurologic:  Normal speech and language. No gross focal neurologic deficits are appreciated.  2+ patellar reflexes. 5/5 strength in the bilateral upper/lower extremities.  Skin:  Skin is warm, dry and intact. No rash noted.  ____________________________________________   LABS (all labs ordered are listed, but only abnormal results are displayed)  Labs Reviewed  BASIC METABOLIC PANEL - Abnormal; Notable for the following components:      Result Value   Glucose, Bld 107 (*)    All other components within normal limits  C-REACTIVE PROTEIN - Abnormal; Notable for the following components:   CRP 1.1 (*)    All other components within normal limits  CBC WITH DIFFERENTIAL/PLATELET  RAPID URINE DRUG SCREEN, HOSP PERFORMED  URINALYSIS, ROUTINE W REFLEX MICROSCOPIC  HIV ANTIBODY (ROUTINE TESTING W REFLEX)  SEDIMENTATION RATE  MAGNESIUM  RPR   ____________________________________________  EKG   EKG Interpretation  Date/Time:  Thursday June 04 2022 11:23:25 EDT Ventricular Rate:  111  PR Interval:  154 QRS Duration: 104 QT Interval:  368 QTC Calculation: 500 R Axis:   10 Text Interpretation: Sinus tachycardia with frequent and consecutive Premature ventricular complexes Possible Anterior infarct , age undetermined Abnormal ECG When compared with ECG of 13-Jan-2022 13:35, PREVIOUS ECG IS PRESENT Similar to Jan 2023 tracing Confirmed by Alona Bene [phone]) on 06/04/2022 10:18:34 PM        ____________________________________________  RADIOLOGY  MR Lumbar Spine W Wo Contrast  Result Date: 06/04/2022 CLINICAL DATA:  Epidural abscess suspected. Bilateral leg weakness, decreased sensation in the right lower leg, and falls. Low back pain. History of IV drug use and HIV. EXAM: MRI LUMBAR SPINE WITHOUT AND WITH CONTRAST TECHNIQUE: Multiplanar and multiecho pulse sequences of the lumbar spine were obtained without and with intravenous contrast. CONTRAST:  9.46mL GADAVIST GADOBUTROL 1 MMOL/ML IV SOLN COMPARISON:  None Available. FINDINGS: Segmentation:  Standard. Alignment: Mild-to-moderate lumbar dextroscoliosis. Increased lumbar lordosis. Bilateral L5 pars defects with 4 mm anterolisthesis of L5 on S1. Vertebrae: No lumbar spine fracture, suspicious marrow lesion, significant marrow edema, or evidence of discitis. Mild chronic anterior wedging of the T11 vertebral body. Conus medullaris and cauda equina: Conus extends to the T12 level. Conus and cauda equina appear normal. Paraspinal and other soft tissues: No epidural or paraspinal fluid collection. Disc levels: T12-L1: Normal disc. Mild facet and ligamentum flavum hypertrophy without stenosis. L1-2: Normal disc. Mild facet and ligamentum flavum hypertrophy without stenosis. L2-3: Normal disc. Moderate to severe right and mild left facet hypertrophy without stenosis. L3-4: Minimal disc bulging and moderate facet and ligamentum flavum hypertrophy without stenosis. L4-5: Mild-to-moderate facet and ligamentum flavum hypertrophy without stenosis. L5-S1: Disc desiccation and mild disc space narrowing. Anterolisthesis with left eccentric bulging of uncovered disc result in mild right and moderate left neural foraminal stenosis without spinal stenosis. IMPRESSION: 1. No evidence of infection in the lumbar spine. 2. L5 pars defects with grade 1 anterolisthesis and mild right and moderate left neural  foraminal stenosis. 3. Diffuse facet hypertrophy. 4. No spinal stenosis. Electronically Signed   By: Sebastian Ache M.D.   On: 06/04/2022 16:22    ____________________________________________   PROCEDURES  Procedure(s) performed:   Procedures  None  ____________________________________________   INITIAL IMPRESSION / ASSESSMENT AND PLAN / ED COURSE  Pertinent labs & imaging results that were available during my care of the patient were reviewed by me and considered in my medical decision making (see chart for details).   This patient is Presenting for Evaluation of falls, which does require a range of treatment options, and is a complaint that involves a high risk of morbidity and mortality.  The Differential Diagnoses  includes but is not exclusive to musculoskeletal back pain, renal colic, urinary tract infection, pyelonephritis, intra-abdominal causes of back pain, aortic aneurysm or dissection, cauda equina syndrome, sciatica, lumbar disc disease, thoracic disc disease, etc.   Critical Interventions-    Medications  gadobutrol (GADAVIST) 1 MMOL/ML injection 9.5 mL (9.5 mLs Intravenous Contrast Given 06/04/22 1542)    Reassessment after intervention: Symptoms similar to arrival.   I decided to review pertinent External Data, and in summary patient with multiple ED visits for various issues in the last year. No advanced spine imaging in our system.   Clinical Laboratory Tests Ordered, included sed rate and CRP here unremarkable.  Normal WBC count.  No acute kidney injury.  HIV antibody is nonreactive.   Radiologic Tests Ordered, included MR lumbar spine w/o contrast. I independently interpreted the  images and agree with radiology interpretation.   Cardiac Monitor Tracing which shows NSR.   Social Determinants of Health Risk patient experiencing homelessness and a polysubstance abuser with IVDA history.   Consult complete with case mgmt regarding community resources and  wheelchair. Wheelchair ordered and arranged with fiance to return tomorrow to the ED once the chair is delivered.   Medical Decision Making: Summary:  Patient presents emergency department with leg pain, back pain, legs giving out type feeling.  No clear neurologic deficits.  This is a recurrent issue for the patient.  He ambulates with a cane but does continue to have some falls.  He is an IV drug user which increases his risk.  Not see any open sores or lab abnormality to strongly suspect infection.  The MRI of the lumbar spine was performed with and without contrast with no discitis or osteomyelitis appreciated.  I have coordinated with case management to try and help the patient with list of area resources and wheelchair to use to prevent falls.  Had Ladislav Caselli discussion with the patient regarding need for close PCP follow-up.   Reevaluation with update and discussion with patient. Plan for d/c with return to the ED tomorrow to receive wheelchair.    Disposition: discharge  ____________________________________________  FINAL CLINICAL IMPRESSION(S) / ED DIAGNOSES  Final diagnoses:  Fall, initial encounter    Note:  This document was prepared using Dragon voice recognition software and may include unintentional dictation errors.  Alona Bene, MD, [facility] Emergency Medicine    Esmae Donathan, Arlyss Repress, MD 06/04/22 2224

## 2022-06-04 NOTE — Care Management (Signed)
Met with patient in rm 3 to discuss DME needs recommendation for a w/c, explained we would not be able to order until tomorrow.  Unable to have the w/c delivered due to living situation. But patient has agreed to come back to ED tomorrow to pick up the w/c .  Updated Daytime ED Case Manager to follow up with patient.

## 2022-06-04 NOTE — ED Provider Triage Note (Signed)
Emergency Medicine Provider Triage Evaluation Note  Edward Wolfe , a 36 y.o. male  was evaluated in triage.  Pt complains of bilateral leg weakness, decreased sensation to right lower leg, and falls.  Patient reports that over the last 9 months he has had weakness to bilateral legs.  Patient states that sometimes her legs will go out from underneath him.  This has been worse over the last few weeks.  Patient states that this has occurred 3 times today.  Patient endorses hitting his head but denies any loss of consciousness.  Patient complains of pain to lumbar back.  Rates pain 9/10 on the pain scale.  Reports decree sensation to right lower extremity.  Patient endorses IV drug use.  Last used IV drugs 3 weeks prior.  Patient denies any loss of consciousness, lightheadedness, chest pain, shortness of breath, neck pain, saddle anesthesia, fever, chills.  Review of Systems  Positive: See above Negative: See above  Physical Exam  BP 123/73 (BP Location: Right Arm)   Pulse (!) 37 Comment: Multple PVCs  Temp 98.6 F (37 C) (Oral)   Resp 14   Ht 6' (1.829 m)   Wt 90.7 kg   SpO2 98%   BMI 27.12 kg/m  Gen:   Awake, no distress   Resp:  Normal effort  MSK:   Moves extremities without difficulty  Other:  No deformity to cervical, thoracic, or lumbar spine.  Tenderness to lumbar back.  No erythema or swelling noted to lumbar back.  Patient reports decree sensation to light touch to right lower extremity.  Patient is able to lift both lower extremities while in seated position.  Medical Decision Making  Medically screening exam initiated at 11:40 AM.  Appropriate orders placed.  Albert Advani was informed that the remainder of the evaluation will be completed by another provider, this initial triage assessment does not replace that evaluation, and the importance of remaining in the ED until their evaluation is complete.  EKG shows multiple PVCs, patient has had history of the same.  With  reports of back pain, leg weakness, and IV drug use concern for epidural abscess will obtain MRI imaging.   Haskel Schroeder, [place] 06/04/22 1142

## 2022-06-05 LAB — RPR: RPR Ser Ql: NONREACTIVE

## 2022-06-18 ENCOUNTER — Other Ambulatory Visit (HOSPITAL_COMMUNITY): Payer: Self-pay | Admitting: Psychiatry

## 2022-06-19 ENCOUNTER — Other Ambulatory Visit: Payer: Self-pay

## 2022-06-19 MED ORDER — HYDROXYZINE HCL 25 MG PO TABS
ORAL_TABLET | ORAL | 0 refills | Status: DC
  Filled 2022-06-19 – 2022-07-30 (×3): qty 30, 10d supply, fill #0

## 2022-06-19 MED ORDER — ARIPIPRAZOLE 5 MG PO TABS
ORAL_TABLET | ORAL | 0 refills | Status: DC
  Filled 2022-06-19 – 2022-07-30 (×3): qty 30, 30d supply, fill #0

## 2022-06-24 ENCOUNTER — Other Ambulatory Visit: Payer: Self-pay

## 2022-06-26 ENCOUNTER — Other Ambulatory Visit: Payer: Self-pay

## 2022-06-30 ENCOUNTER — Other Ambulatory Visit: Payer: Self-pay

## 2022-07-22 ENCOUNTER — Other Ambulatory Visit: Payer: Self-pay

## 2022-07-23 ENCOUNTER — Other Ambulatory Visit: Payer: Self-pay

## 2022-07-28 ENCOUNTER — Other Ambulatory Visit: Payer: Self-pay

## 2022-07-29 ENCOUNTER — Other Ambulatory Visit: Payer: Self-pay

## 2022-07-30 ENCOUNTER — Other Ambulatory Visit: Payer: Self-pay

## 2022-08-06 ENCOUNTER — Other Ambulatory Visit: Payer: Self-pay

## 2022-09-01 ENCOUNTER — Other Ambulatory Visit: Payer: Self-pay

## 2022-09-01 ENCOUNTER — Ambulatory Visit (INDEPENDENT_AMBULATORY_CARE_PROVIDER_SITE_OTHER): Payer: Commercial Managed Care - HMO | Admitting: Physician Assistant

## 2022-09-01 VITALS — BP 143/83 | HR 42 | Temp 98.1°F | Ht 72.0 in | Wt 250.0 lb

## 2022-09-01 DIAGNOSIS — Z7721 Contact with and (suspected) exposure to potentially hazardous body fluids: Secondary | ICD-10-CM | POA: Diagnosis not present

## 2022-09-01 DIAGNOSIS — Z206 Contact with and (suspected) exposure to human immunodeficiency virus [HIV]: Secondary | ICD-10-CM | POA: Diagnosis not present

## 2022-09-01 DIAGNOSIS — F1511 Other stimulant abuse, in remission: Secondary | ICD-10-CM

## 2022-09-01 NOTE — Patient Instructions (Signed)
It was so nice meeting you today "CD"  Testing for HIV and HCV completed today IF negative will start PrEP and refer to our clinic for ongoing management Continue to reach out to supportive members in your life to maintain sobriety Recommend 12 step program

## 2022-09-01 NOTE — Progress Notes (Signed)
Subjective:    Patient ID: Edward Wolfe, male    DOB: 1986/07/29, 36 y.o.   MRN: [id number]  Chief Complaint  Patient presents with   SEXUALLY TRANSMITTED DISEASE    Patient requesting HIV and HCV testing, declines testing for RPR and GC/chlamydia      HPI:  Edward Wolfe is a 36 y.o. male who completed a 30 day inpatient drug rehab program in [place], [place] for IVDU-methamphetamine. Other illicit drug use: THC. Minimal alcohol.   He is currently living in a hotel with his fiance, monogamous relationship, MSM.  Partner is HIV positive, undetectable. They often share needles. He denies any recent fever, chills, rash, HA, n/v/d/. He reports when in prison 03/2022 was told he was HIV positive.  HIV ab negative 6/23. Also was notified by rehab he was +HCV ab.  He would like to have both confirmed. His partner is also a previous methamphetamine user though he is optimistic about his recovery.  His mood overall is positive "as long as my partner stays in recovery."  He is interested in initiating PrEP.     Allergies  Allergen Reactions   Morphine Other (See Comments)    "does the opposite effect, turns me into a zombie"   Morphine And Related     "made him a zombie"      Outpatient Medications Prior to Visit  Medication Sig Dispense Refill   ARIPiprazole (ABILIFY) 5 MG tablet Take 1 tablet (5 mg total) by mouth daily. 30 tablet 3   doxycycline (VIBRAMYCIN) 100 MG capsule Take 1 capsule (100 mg total) by mouth 2 (two) times daily. 20 capsule 0   gabapentin (NEURONTIN) 100 MG capsule Take 2 capsules by mouth 3 times daily for nerve pain 180 capsule 3   hydrOXYzine (ATARAX) 25 MG tablet Take 1 tablet (25 mg total) by mouth 3 (three) times daily as needed for anxiety. For anxiety 30 tablet 3   sertraline (ZOLOFT) 25 MG tablet Take 3 tabs by mouth daily for depression 90 tablet 3   traZODone (DESYREL) 50 MG tablet Take 1 tablet (50 mg total) by mouth at bedtime as needed for sleep. For  sleep 30 tablet 3   ARIPiprazole (ABILIFY) 5 MG tablet Take 1 tablet by mouth once daily (Patient not taking: Reported on 09/01/2022) 30 tablet 0   gabapentin (NEURONTIN) 100 MG capsule Take 2 capsules (200 mg total) by mouth 3 (three) times daily. For nerve pain (Patient not taking: Reported on 09/01/2022) 30 capsule 0   hydrOXYzine (ATARAX) 25 MG tablet Take 1 tablet by mouth three times daily as needed for anxiety (Patient not taking: Reported on 09/01/2022) 30 tablet 0   Multiple Vitamin (MULTIVITAMIN WITH MINERALS) TABS tablet Take 1 tablet by mouth daily. For nutritional support (Patient not taking: Reported on 09/01/2022) 30 tablet 3   nicotine (NICODERM CQ - DOSED IN MG/24 HOURS) 21 mg/24hr patch Place 1 patch (21 mg total) onto the skin daily. For smoking cessation (Patient not taking: Reported on 09/01/2022) 1 patch 0   traZODone (DESYREL) 50 MG tablet Take 1 tab by mouth at bedtime for sleep (Patient not taking: Reported on 09/01/2022) 30 tablet 0   No facility-administered medications prior to visit.     Past Medical History:  Diagnosis Date   Bipolar 1 disorder [facility])      Past Surgical History:  Procedure Laterality Date   HERNIA REPAIR     tube in ear  Review of Systems  Constitutional:  Negative for appetite change, fatigue, fever and unexpected weight change.  HENT: Negative.    Respiratory:  Negative for cough, shortness of breath and wheezing.   Cardiovascular:  Negative for chest pain and leg swelling.  Gastrointestinal:  Negative for nausea and vomiting.  Genitourinary:  Negative for dysuria, genital sores and penile discharge.  Musculoskeletal:  Positive for gait problem (walking with a cane).  Skin:  Positive for wound (healing lesions from "picking" wounds thorughout extremities).      Objective:    BP (!) 143/83   Pulse (!) 42   Temp 98.1 F (36.7 C) (Oral)   Ht 6' (1.829 m)   Wt 250 lb (113.4 kg)   SpO2 96%   BMI 33.91 kg/m  Nursing note and  vital signs reviewed.  Physical Exam Vitals reviewed.  Constitutional:      General: He is not in acute distress.    Appearance: He is obese. He is not ill-appearing, toxic-appearing or diaphoretic.  HENT:     Mouth/Throat:     Comments: Poor dentition due to chronic methamphetamine use Eyes:     Conjunctiva/sclera: Conjunctivae normal.     Pupils: Pupils are equal, round, and reactive to light.  Cardiovascular:     Rate and Rhythm: Regular rhythm. Bradycardia present.  Pulmonary:     Effort: Pulmonary effort is normal.     Breath sounds: Normal breath sounds.  Musculoskeletal:     Cervical back: Normal range of motion.  Skin:    General: Skin is warm and dry.     Findings: Lesion (scattered along extremities, various stages of healing, no secondary infection noted) present.  Neurological:     Mental Status: He is alert.     Gait: Gait abnormal.  Psychiatric:        Mood and Affect: Mood normal.        Behavior: Behavior normal.        Thought Content: Thought content normal.        Judgment: Judgment normal.         10/23/2021    2:30 PM 01/16/2021    4:27 PM  Depression screen PHQ 2/9  Decreased Interest    Down, Depressed, Hopeless    PHQ - 2 Score    Altered sleeping    Tired, decreased energy    Change in appetite    Feeling bad or failure about yourself     Trouble concentrating    Moving slowly or fidgety/restless    Suicidal thoughts    PHQ-9 Score    Difficult doing work/chores       Information is confidential and restricted. Go to Review Flowsheets to unlock data.       Assessment & Plan:  Methamphetamine use in remission: discharged from inpatient rehab 08/31/22, returning to fiance who also struggles with methamphetamine abuse, +HIV partner. He is optimistic about recovery and motivated.  Exposure to HIV: partner HIV-1 +, stable on ART per history, report + HIV test at home 3/23, Epic HIV ab non reactive 6/23; also reports HCV positive while in  rehab and prision 4/23.  Will recheck today. Declines GC/chlamydia testing. He is interested in initiating PrEP. Asymptomatic  Will call or mychart with results.  Patient Active Problem List   Diagnosis Date Noted   GAD (generalized anxiety disorder) 11/04/2021   Methamphetamine use disorder, moderate (HCC) 11/04/2021   Tobacco use disorder, moderate, dependence 11/04/2021   Bipolar disorder (HCC) 10/29/2021  Bipolar 1 disorder, mixed, moderate (HCC) 01/17/2021   Substance induced mood disorder (HCC) 01/17/2021   Amphetamine and psychostimulant-induced psychosis with delusions (HCC)    Bipolar I disorder, most recent episode depressed (HCC)      Problem List Items Addressed This Visit   None Visit Diagnoses     Methamphetamine abuse in remission (HCC)    -  Primary   Exposure to HIV       Relevant Orders   Basic metabolic panel   Hepatitis B surface antibody,qualitative   Hepatitis C antibody   HIV Antibody (routine testing w rflx)   HIV-1 RNA ultraquant reflex to gentyp+   Hepatitis B surface antigen   Patient exposure to body fluids       Relevant Orders   Basic metabolic panel   Hepatitis B surface antibody,qualitative   Hepatitis C antibody   HIV Antibody (routine testing w rflx)   HIV-1 RNA ultraquant reflex to gentyp+   Hepatitis B surface antigen        I am having Edward Wolfe "CD" maintain his nicotine, gabapentin, sertraline, gabapentin, traZODone, ARIPiprazole, traZODone, multivitamin with minerals, hydrOXYzine, doxycycline, ARIPiprazole, and hydrOXYzine.   No orders of the defined types were placed in this encounter.    Follow-up: Return if symptoms worsen or fail to improve.

## 2022-09-04 LAB — BASIC METABOLIC PANEL
BUN: 16 mg/dL (ref 7–25)
CO2: 30 mmol/L (ref 20–32)
Calcium: 10.1 mg/dL (ref 8.6–10.3)
Chloride: 103 mmol/L (ref 98–110)
Creat: 0.79 mg/dL (ref 0.60–1.26)
Glucose, Bld: 90 mg/dL (ref 65–99)
Potassium: 4.2 mmol/L (ref 3.5–5.3)
Sodium: 140 mmol/L (ref 135–146)

## 2022-09-04 LAB — HIV-1 RNA ULTRAQUANT REFLEX TO GENTYP+
HIV 1 RNA Quant: NOT DETECTED copies/mL
HIV-1 RNA Quant, Log: NOT DETECTED Log copies/mL

## 2022-09-04 LAB — HEPATITIS C ANTIBODY: Hepatitis C Ab: NONREACTIVE

## 2022-09-04 LAB — HIV ANTIBODY (ROUTINE TESTING W REFLEX): HIV 1&2 Ab, 4th Generation: NONREACTIVE

## 2022-09-04 LAB — HEPATITIS B SURFACE ANTIGEN: Hepatitis B Surface Ag: NONREACTIVE

## 2022-09-04 LAB — HEPATITIS B SURFACE ANTIBODY,QUALITATIVE: Hep B S Ab: REACTIVE — AB

## 2022-09-07 ENCOUNTER — Telehealth: Payer: Self-pay

## 2022-09-07 NOTE — Telephone Encounter (Signed)
Attempted to reach out to patient for Prep recommendations. Unable to reach patient at this time. Patient has yet to ready previous Mychart messages.  Edward Wolfe

## 2022-09-07 NOTE — Telephone Encounter (Signed)
-----   Message from Robert Bellow, [place] sent at 09/07/2022  9:10 AM EDT ----- Please call CD, let him know results and tell him we should initiate PrEP as discussed.

## 2022-09-08 ENCOUNTER — Ambulatory Visit: Payer: Self-pay | Admitting: Physician Assistant

## 2022-09-10 ENCOUNTER — Emergency Department (HOSPITAL_COMMUNITY): Payer: Commercial Managed Care - HMO

## 2022-09-10 ENCOUNTER — Other Ambulatory Visit: Payer: Self-pay

## 2022-09-10 ENCOUNTER — Emergency Department (HOSPITAL_COMMUNITY)
Admission: EM | Admit: 2022-09-10 | Discharge: 2022-09-10 | Disposition: A | Discharge status: Home / Self Care | Payer: Commercial Managed Care - HMO | Attending: Emergency Medicine | Admitting: Emergency Medicine

## 2022-09-10 ENCOUNTER — Encounter (HOSPITAL_COMMUNITY): Payer: Self-pay

## 2022-09-10 DIAGNOSIS — F17210 Nicotine dependence, cigarettes, uncomplicated: Secondary | ICD-10-CM | POA: Diagnosis not present

## 2022-09-10 DIAGNOSIS — K047 Periapical abscess without sinus: Secondary | ICD-10-CM | POA: Diagnosis not present

## 2022-09-10 DIAGNOSIS — R1084 Generalized abdominal pain: Secondary | ICD-10-CM | POA: Diagnosis not present

## 2022-09-10 DIAGNOSIS — R220 Localized swelling, mass and lump, head: Secondary | ICD-10-CM | POA: Diagnosis present

## 2022-09-10 HISTORY — DX: Unspecified viral hepatitis C without hepatic coma: B19.20

## 2022-09-10 LAB — CBC WITH DIFFERENTIAL/PLATELET
Abs Immature Granulocytes: 0.06 10*3/uL (ref 0.00–0.07)
Basophils Absolute: 0 10*3/uL (ref 0.0–0.1)
Basophils Relative: 0 %
Eosinophils Absolute: 0 10*3/uL (ref 0.0–0.5)
Eosinophils Relative: 0 %
HCT: 46.2 % (ref 39.0–52.0)
Hemoglobin: 14.9 g/dL (ref 13.0–17.0)
Immature Granulocytes: 0 %
Lymphocytes Relative: 17 %
Lymphs Abs: 2.6 10*3/uL (ref 0.7–4.0)
MCH: 28 pg (ref 26.0–34.0)
MCHC: 32.3 g/dL (ref 30.0–36.0)
MCV: 86.8 fL (ref 80.0–100.0)
Monocytes Absolute: 1.7 10*3/uL — ABNORMAL HIGH (ref 0.1–1.0)
Monocytes Relative: 11 %
Neutro Abs: 10.7 10*3/uL — ABNORMAL HIGH (ref 1.7–7.7)
Neutrophils Relative %: 72 %
Platelets: 185 10*3/uL (ref 150–400)
RBC: 5.32 MIL/uL (ref 4.22–5.81)
RDW: 15.2 % (ref 11.5–15.5)
WBC: 15.2 10*3/uL — ABNORMAL HIGH (ref 4.0–10.5)
nRBC: 0 % (ref 0.0–0.2)

## 2022-09-10 LAB — COMPREHENSIVE METABOLIC PANEL
ALT: 20 U/L (ref 0–44)
AST: 19 U/L (ref 15–41)
Albumin: 4.1 g/dL (ref 3.5–5.0)
Alkaline Phosphatase: 62 U/L (ref 38–126)
Anion gap: 8 (ref 5–15)
BUN: 10 mg/dL (ref 6–20)
CO2: 28 mmol/L (ref 22–32)
Calcium: 9.9 mg/dL (ref 8.9–10.3)
Chloride: 104 mmol/L (ref 98–111)
Creatinine, Ser: 0.8 mg/dL (ref 0.61–1.24)
GFR, Estimated: >60 mL/min (ref >60)
Glucose, Bld: 106 mg/dL — ABNORMAL HIGH (ref 70–99)
Potassium: 4.3 mmol/L (ref 3.5–5.1)
Sodium: 140 mmol/L (ref 135–145)
Total Bilirubin: 1.6 mg/dL — ABNORMAL HIGH (ref 0.3–1.2)
Total Protein: 7.6 g/dL (ref 6.5–8.1)

## 2022-09-10 LAB — LIPASE, BLOOD: Lipase: 23 U/L (ref 11–51)

## 2022-09-10 MED ORDER — ACETAMINOPHEN 500 MG PO TABS
1000.0000 mg | ORAL_TABLET | Freq: Once | ORAL | Status: AC
  Administered 2022-09-10: 1000 mg via ORAL
  Filled 2022-09-10: qty 2

## 2022-09-10 MED ORDER — AMOXICILLIN-POT CLAVULANATE 875-125 MG PO TABS
1.0000 | ORAL_TABLET | Freq: Two times a day (BID) | ORAL | 0 refills | Status: DC
  Filled 2022-09-10: qty 14, 7d supply, fill #0

## 2022-09-10 MED ORDER — LIDOCAINE VISCOUS HCL 2 % MT SOLN
15.0000 mL | Freq: Once | OROMUCOSAL | Status: AC
  Administered 2022-09-10: 15 mL via ORAL
  Filled 2022-09-10: qty 15

## 2022-09-10 MED ORDER — ALUM & MAG HYDROXIDE-SIMETH 200-200-20 MG/5ML PO SUSP
30.0000 mL | Freq: Once | ORAL | Status: AC
  Administered 2022-09-10: 30 mL via ORAL
  Filled 2022-09-10: qty 30

## 2022-09-10 MED ORDER — AMOXICILLIN-POT CLAVULANATE 875-125 MG PO TABS
1.0000 | ORAL_TABLET | Freq: Once | ORAL | Status: AC
  Administered 2022-09-10: 1 via ORAL
  Filled 2022-09-10: qty 1

## 2022-09-10 MED ORDER — IOHEXOL 300 MG/ML  SOLN
100.0000 mL | Freq: Once | INTRAMUSCULAR | Status: AC | PRN
  Administered 2022-09-10: 100 mL via INTRAVENOUS

## 2022-09-10 NOTE — ED Notes (Signed)
Pt was offered wheelchair assistance. Pt denied and decided to walk with a cane.

## 2022-09-10 NOTE — ED Provider Triage Note (Signed)
Emergency Medicine Provider Triage Evaluation Note  Kalon Erhardt , a 36 y.o. male  was evaluated in triage.  Pt complains of abdominal pain that has been intermittent over the past couple weeks.  However over the past 2 days it has significantly worsened.  Denies nausea, vomiting.  Denies blood in stool.  Has history of hep C, and bipolar.  Review of Systems  Positive: As above Negative: As above  Physical Exam  BP 135/86 (BP Location: Left Arm)   Pulse 89   Temp 99.8 F (37.7 C) (Oral)   Resp 17   Ht 6' (1.829 m)   Wt 113.4 kg   SpO2 98%   BMI 33.91 kg/m  Gen:   Awake, no distress   Resp:  Normal effort  MSK:   Moves extremities without difficulty Other:    Medical Decision Making  Medically screening exam initiated at 12:56 PM.  Appropriate orders placed.  Camden Sharrar was informed that the remainder of the evaluation will be completed by another provider, this initial triage assessment does not replace that evaluation, and the importance of remaining in the ED until their evaluation is complete.    Evlyn Courier, PA-C 09/10/22 1257

## 2022-09-10 NOTE — Discharge Instructions (Addendum)
Follow-up with Dr. Geralynn Ochs next week.  Get seen sooner if any problems

## 2022-09-10 NOTE — ED Notes (Signed)
Pt needs iv and labs for CT scan

## 2022-09-10 NOTE — ED Triage Notes (Signed)
Per EMS- Patient  is homeless. patient c/o  abdominal pain x 2 weeks, but worse today.  Patient also reports that he has Hepatitis C and has not been taking his medication.

## 2022-09-10 NOTE — ED Provider Notes (Signed)
[facility]-EMERGENCY DEPT Provider Note  CSN: [id number] Arrival date & time: 09/10/22 1151  Chief Complaint(s) Abdominal Pain  HPI Edward Daphine DeutscherMartin is a 36 y.o. male with a history of bipolar disorder, hepatitis presenting to the emergency department with abdominal pain.  He reports that abdominal pain is diffuse.  Nothing makes it better or worse.  It is then present for a few weeks, intermittently.  Denies any fevers, chills, nausea, vomiting, diarrhea, constipation, blood in the stool, melena.  He also reports facial swelling, starting today, to the right lower face.  Reports some pain.  Denies history of similar in the past.  No trouble swallowing.  No fevers or chills.   Past Medical History Past Medical History:  Diagnosis Date  . Bipolar 1 disorder (HCC)   . Hepatitis C    Patient Active Problem List   Diagnosis Date Noted  . GAD (generalized anxiety disorder) 11/04/2021  . Methamphetamine use disorder, moderate (HCC) 11/04/2021  . Tobacco use disorder, moderate, dependence 11/04/2021  . Bipolar disorder (HCC) 10/29/2021  . Bipolar 1 disorder, mixed, moderate (HCC) 01/17/2021  . Substance induced mood disorder (HCC) 01/17/2021  . Amphetamine and psychostimulant-induced psychosis with delusions (HCC)   . Bipolar I disorder, most recent episode depressed (HCC)    Home Medication(s) Prior to Admission medications   Medication Sig Start Date End Date Taking? Authorizing Provider  amoxicillin-clavulanate (AUGMENTIN) 875-125 MG tablet Take 1 tablet by mouth every 12 (twelve) hours. 09/10/22  Yes Lonell GrandchildScheving, Tayvien Kane L, MD  ARIPiprazole (ABILIFY) 5 MG tablet Take 1 tablet (5 mg total) by mouth daily. 01/02/22   Shanna CiscoParsons, Brittney E, NP  ARIPiprazole (ABILIFY) 5 MG tablet Take 1 tablet by mouth once daily Patient not taking: Reported on 09/01/2022 06/19/22   Shanna CiscoParsons, Brittney E, NP  doxycycline (VIBRAMYCIN) 100 MG capsule Take 1 capsule (100 mg total) by mouth 2  (two) times daily. 01/13/22   Bethann BerkshireZammit, Joseph, MD  gabapentin (NEURONTIN) 100 MG capsule Take 2 capsules (200 mg total) by mouth 3 (three) times daily. For nerve pain Patient not taking: Reported on 09/01/2022 11/04/21   Starleen BlueNkwenti, Doris, NP  gabapentin (NEURONTIN) 100 MG capsule Take 2 capsules by mouth 3 times daily for nerve pain 11/24/21   Toy CookeyParsons, Brittney E, NP  hydrOXYzine (ATARAX) 25 MG tablet Take 1 tablet (25 mg total) by mouth 3 (three) times daily as needed for anxiety. For anxiety 01/02/22   Shanna CiscoParsons, Brittney E, NP  hydrOXYzine (ATARAX) 25 MG tablet Take 1 tablet by mouth three times daily as needed for anxiety Patient not taking: Reported on 09/01/2022 06/19/22   Shanna CiscoParsons, Brittney E, NP  Multiple Vitamin (MULTIVITAMIN WITH MINERALS) TABS tablet Take 1 tablet by mouth daily. For nutritional support Patient not taking: Reported on 09/01/2022 01/02/22   Toy CookeyParsons, Brittney E, NP  nicotine (NICODERM CQ - DOSED IN MG/24 HOURS) 21 mg/24hr patch Place 1 patch (21 mg total) onto the skin daily. For smoking cessation Patient not taking: Reported on 09/01/2022 11/05/21   Starleen BlueNkwenti, Doris, NP  sertraline (ZOLOFT) 25 MG tablet Take 3 tabs by mouth daily for depression 11/24/21   Shanna CiscoParsons, Brittney E, NP  traZODone (DESYREL) 50 MG tablet Take 1 tab by mouth at bedtime for sleep Patient not taking: Reported on 09/01/2022 01/02/22   Shanna CiscoParsons, Brittney E, NP  traZODone (DESYREL) 50 MG tablet Take 1 tablet (50 mg total) by mouth at bedtime as needed for sleep. For sleep 01/02/22   Shanna CiscoParsons, Brittney E, NP  Past Surgical History Past Surgical History:  Procedure Laterality Date  . HERNIA REPAIR    . tube in ear     Family History Family History  Problem Relation Age of Onset  . Hypertension Mother   . Cancer Father   . Hypertension Father   . High Cholesterol Father     Social  History Social History   Tobacco Use  . Smoking status: Every Day    Packs/day: 1.00    Types: Cigarettes  . Smokeless tobacco: Never  Vaping Use  . Vaping Use: Never used  Substance Use Topics  . Alcohol use: Yes  . Drug use: Not Currently    Types: Marijuana, Methamphetamines   Allergies Morphine and Morphine and related  Review of Systems Review of Systems  All other systems reviewed and are negative.   Physical Exam Vital Signs  I have reviewed the triage vital signs BP 135/86 (BP Location: Left Arm)   Pulse 89   Temp 99.8 F (37.7 C) (Oral)   Resp 17   Ht 6' (1.829 m)   Wt 113.4 kg   SpO2 98%   BMI 33.91 kg/m  Physical Exam Vitals and nursing note reviewed.  Constitutional:      General: He is not in acute distress.    Appearance: Normal appearance.  HENT:     Mouth/Throat:     Mouth: Mucous membranes are moist.     Comments: Very poor dentition throughout, external face with mild right lower jaw swelling, some tenderness with percussion of the teeth on the right jaw with no evidence of obvious periapical abscess.  Floor of mouth soft.  No muffled voice.  Tolerating secretions.  No stridor.  Uvula midline. Eyes:     Conjunctiva/sclera: Conjunctivae normal.  Cardiovascular:     Rate and Rhythm: Normal rate and regular rhythm.  Pulmonary:     Effort: Pulmonary effort is normal. No respiratory distress.     Breath sounds: Normal breath sounds.  Abdominal:     General: Abdomen is flat.     Palpations: Abdomen is soft.     Tenderness: There is generalized abdominal tenderness (mild).  Musculoskeletal:     Right lower leg: No edema.     Left lower leg: No edema.  Skin:    General: Skin is warm and dry.     Capillary Refill: Capillary refill takes less than 2 seconds.  Neurological:     Mental Status: He is alert and oriented to person, place, and time. Mental status is at baseline.  Psychiatric:        Mood and Affect: Mood normal.        Behavior:  Behavior normal.     ED Results and Treatments Labs (all labs ordered are listed, but only abnormal results are displayed) Labs Reviewed  CBC WITH DIFFERENTIAL/PLATELET  COMPREHENSIVE METABOLIC PANEL  LIPASE, BLOOD  URINALYSIS, ROUTINE W REFLEX MICROSCOPIC  Radiology No results found.  Pertinent labs & imaging results that were available during my care of the patient were reviewed by me and considered in my medical decision making (see MDM for details).  Medications Ordered in ED Medications  acetaminophen (TYLENOL) tablet 1,000 mg (1,000 mg Oral Given 09/10/22 1524)                                                                                                                                     Procedures Procedures  (including critical care time)  Medical Decision Making / ED Course   MDM:  36 year old male presenting to the emergency department with abdominal pain and facial swelling.  Patient well-appearing, vital signs unremarkable.  Exam notable for very poor dentition and right-sided facial swelling as well as diffuse abdominal tenderness.  Unclear cause of abdominal pain, given tenderness will check labs including lipase, CMP, and CT scan, without associated symptoms will trial Maalox, may gastritis.  Without nausea or vomiting, lower concern for obstruction, perforation, cholecystitis, pancreatitis.  Given facial swelling, will obtain CT scan.  No obvious evidence of Ludwick's angina on exam, no sign of obvious periapical abscess which can be drained at the bedside, no evidence of peritonsillar abscess.  Will evaluate for occult dental abscess.  Clinical Course as of 09/10/22 1542  Thu Sep 10, 2022  1542 Signed out to Dr. Estell Harpin pending CT scan and re-assessment as well as labs.  [WS]    Clinical Course User Index [WS] Lonell Grandchild, MD      Additional history obtained: -External records from outside source obtained and reviewed including: Chart review including previous notes, labs, imaging, consultation notes   Lab Tests: -I ordered, reviewed, and interpreted labs.   The pertinent results include:   Labs Reviewed  CBC WITH DIFFERENTIAL/PLATELET  COMPREHENSIVE METABOLIC PANEL  LIPASE, BLOOD  URINALYSIS, ROUTINE W REFLEX MICROSCOPIC        Medicines ordered and prescription drug management: Meds ordered this encounter  Medications  . acetaminophen (TYLENOL) tablet 1,000 mg  . amoxicillin-clavulanate (AUGMENTIN) 875-125 MG tablet    Sig: Take 1 tablet by mouth every 12 (twelve) hours.    Dispense:  14 tablet    Refill:  0    -I have reviewed the patients home medicines and have made adjustments as needed   Social Determinants of Health:  Factors impacting patients care include: polysubstance abuse   Reevaluation: After the interventions noted above, I reevaluated the patient and found that they have improved  Co morbidities that complicate the patient evaluation . Past Medical History:  Diagnosis Date  . Bipolar 1 disorder (HCC)   . Hepatitis C       Dispostion: Pending at sign out    Final Clinical Impression(s) / ED Diagnoses Final diagnoses:  Dental infection     This chart was dictated using voice recognition software.  Despite best efforts to proofread,  errors  can occur which can change the documentation meaning.    Cristie Hem, MD 09/10/22 [phone]

## 2022-09-16 ENCOUNTER — Other Ambulatory Visit: Payer: Self-pay

## 2022-09-30 ENCOUNTER — Telehealth: Payer: Self-pay

## 2022-09-30 ENCOUNTER — Other Ambulatory Visit: Payer: Self-pay

## 2022-09-30 ENCOUNTER — Other Ambulatory Visit (HOSPITAL_COMMUNITY): Payer: Self-pay | Admitting: Psychiatry

## 2022-09-30 NOTE — Telephone Encounter (Signed)
Patient called requesting lab results from last month. Relayed that HIV and Hep C were negative. He is interested in starting PrEP and accepts appointment with [facility] tomorrow morning to discuss further.   Beryle Flock, RN

## 2022-10-01 ENCOUNTER — Ambulatory Visit (INDEPENDENT_AMBULATORY_CARE_PROVIDER_SITE_OTHER): Payer: Commercial Managed Care - HMO | Admitting: Pharmacist

## 2022-10-01 ENCOUNTER — Other Ambulatory Visit: Payer: Self-pay

## 2022-10-01 ENCOUNTER — Encounter: Payer: Self-pay | Admitting: Family Medicine

## 2022-10-01 ENCOUNTER — Other Ambulatory Visit (HOSPITAL_COMMUNITY): Payer: Self-pay

## 2022-10-01 ENCOUNTER — Other Ambulatory Visit (HOSPITAL_COMMUNITY): Payer: Self-pay | Admitting: Psychiatry

## 2022-10-01 ENCOUNTER — Ambulatory Visit (INDEPENDENT_AMBULATORY_CARE_PROVIDER_SITE_OTHER): Payer: Commercial Managed Care - HMO | Admitting: Family Medicine

## 2022-10-01 VITALS — BP 124/78 | HR 49 | Temp 97.6°F | Ht 69.5 in | Wt 237.0 lb

## 2022-10-01 DIAGNOSIS — M545 Low back pain, unspecified: Secondary | ICD-10-CM | POA: Diagnosis not present

## 2022-10-01 DIAGNOSIS — F3162 Bipolar disorder, current episode mixed, moderate: Secondary | ICD-10-CM | POA: Diagnosis not present

## 2022-10-01 DIAGNOSIS — F1520 Other stimulant dependence, uncomplicated: Secondary | ICD-10-CM

## 2022-10-01 DIAGNOSIS — Z23 Encounter for immunization: Secondary | ICD-10-CM | POA: Diagnosis not present

## 2022-10-01 DIAGNOSIS — Z79899 Other long term (current) drug therapy: Secondary | ICD-10-CM

## 2022-10-01 DIAGNOSIS — K047 Periapical abscess without sinus: Secondary | ICD-10-CM | POA: Diagnosis not present

## 2022-10-01 DIAGNOSIS — G8929 Other chronic pain: Secondary | ICD-10-CM

## 2022-10-01 NOTE — Progress Notes (Signed)
Date:  10/01/2022   HPI: Edward Wolfe is a 36 y.o. male who presents to the [facility] to discuss and initiate PrEP.  Insured   [x]   Uninsured  []   Patient Active Problem List   Diagnosis Date Noted   GAD (generalized anxiety disorder) 11/04/2021   Methamphetamine use disorder, moderate ([place]) 11/04/2021   Tobacco use disorder, moderate, dependence 11/04/2021   Bipolar disorder ([place]) 10/29/2021   Bipolar 1 disorder, mixed, moderate ([place]) 01/17/2021   Substance induced mood disorder ([place]) 01/17/2021   Amphetamine and psychostimulant-induced psychosis with delusions ([place])    Bipolar I disorder, most recent episode depressed ([place])     Patient's Medications  New Prescriptions   No medications on file  Previous Medications   AMOXICILLIN-CLAVULANATE (AUGMENTIN) 875-125 MG TABLET    Take 1 tablet by mouth every 12 (twelve) hours.   ARIPIPRAZOLE (ABILIFY) 5 MG TABLET    Take 1 tablet (5 mg total) by mouth daily.   ARIPIPRAZOLE (ABILIFY) 5 MG TABLET    Take 1 tablet by mouth once daily   DOXYCYCLINE (VIBRAMYCIN) 100 MG CAPSULE    Take 1 capsule (100 mg total) by mouth 2 (two) times daily.   GABAPENTIN (NEURONTIN) 100 MG CAPSULE    Take 2 capsules (200 mg total) by mouth 3 (three) times daily. For nerve pain   GABAPENTIN (NEURONTIN) 100 MG CAPSULE    Take 2 capsules by mouth 3 times daily for nerve pain   HYDROXYZINE (ATARAX) 25 MG TABLET    Take 1 tablet (25 mg total) by mouth 3 (three) times daily as needed for anxiety. For anxiety   HYDROXYZINE (ATARAX) 25 MG TABLET    Take 1 tablet by mouth three times daily as needed for anxiety   MULTIPLE VITAMIN (MULTIVITAMIN WITH MINERALS) TABS TABLET    Take 1 tablet by mouth daily. For nutritional support   NICOTINE (NICODERM CQ - DOSED IN MG/24 HOURS) 21 MG/24HR PATCH    Place 1 patch (21 mg total) onto the skin daily. For smoking cessation   SERTRALINE (ZOLOFT) 25 MG TABLET    Take 3 tabs by mouth daily for depression   TRAZODONE  (DESYREL) 50 MG TABLET    Take 1 tablet (50 mg total) by mouth at bedtime for sleep   TRAZODONE (DESYREL) 50 MG TABLET    Take 1 tablet (50 mg total) by mouth at bedtime as needed for sleep. For sleep  Modified Medications   No medications on file  Discontinued Medications   No medications on file    Allergies: Allergies  Allergen Reactions   Morphine Other (See Comments)    "does the opposite effect, turns me into a zombie"   Morphine And Related     "made him a zombie"    Past Medical History: Past Medical History:  Diagnosis Date   Bipolar 1 disorder ([place])    Hepatitis C     Social History: Social History   Socioeconomic History   Marital status: Single    Spouse name: Not on file   Number of children: Not on file   Years of education: Not on file   Highest education level: Not on file  Occupational History   Not on file  Tobacco Use   Smoking status: Every Day    Packs/day: 1.00    Types: Cigarettes   Smokeless tobacco: Never  Vaping Use   Vaping Use: Never used  Substance and Sexual Activity   Alcohol use:  Yes   Drug use: Not Currently    Types: Marijuana, Methamphetamines   Sexual activity: Yes    Birth control/protection: Condom  Other Topics Concern   Not on file  Social History Narrative   Not on file   Social Determinants of Health   Financial Resource Strain: High Risk (01/17/2021)   Overall Financial Resource Strain (CARDIA)    Difficulty of Paying Living Expenses: Hard  Food Insecurity: No Food Insecurity (01/17/2021)   Hunger Vital Sign    Worried About Running Out of Food in the Last Year: Never true    Ran Out of Food in the Last Year: Never true  Transportation Needs: No Transportation Needs (01/17/2021)   PRAPARE - Hydrologist (Medical): No    Lack of Transportation (Non-Medical): No  Physical Activity: Inactive (01/17/2021)   Exercise Vital Sign    Days of Exercise per Week: 0 days    Minutes of Exercise  per Session: 0 min  Stress: Stress Concern Present (01/17/2021)   Melbeta    Feeling of Stress : Rather much  Social Connections: Not on file       10/01/2022   10:33 AM  [facility] HIV PREP FLOWSHEET RESULTS  Insurance Status Insured  How did you hear? HIV+ patrner comes here for HIV crae  Gender at birth Male  Gender identity cis-Male  Risk for HIV In sexual relationship with HIV+ partner;Injection drug use;Stimulant drug use in MSM  Sex Partners Men only  # sex partners past 3-6 mos 1  Sex activity preferences Insertive;Oral  Condom use No  Treated for STI? No  HIV symptoms? None  PrEP Eligibility Yes  Paper work received? No    Labs:  SCr: Lab Results  Component Value Date   CREATININE 0.80 09/10/2022   CREATININE 0.79 09/01/2022   CREATININE 0.93 06/04/2022   CREATININE 0.81 02/01/2022   CREATININE 0.86 01/13/2022   HIV Lab Results  Component Value Date   HIV NON-REACTIVE 09/01/2022   HIV Non Reactive 06/04/2022   Hepatitis B Lab Results  Component Value Date   HEPBSAB REACTIVE (A) 09/01/2022   HEPBSAG NON-REACTIVE 09/01/2022   Hepatitis C Lab Results  Component Value Date   HEPCAB NON-REACTIVE 09/01/2022   Hepatitis A No results found for: "HAV" RPR and STI Lab Results  Component Value Date   LABRPR NON REACTIVE 06/04/2022    STI Results GC CT  09/01/2021  9:27 PM Negative  Negative     Assessment: CD presents to clinic today to initiate PrEP. He met with Edward Wolfe last month after completing a 30-day inpatient drug rehab program in [place], [place]. At that visit, he stated he was optimistic about his recovery from methamphetamine use. He is in a monogamous relationship with his HIV+ partner who also is in recovery; they previously shared needles. He stated he thought he had a positive HIV test in the past. Reviewed that both his HIV antibody and RNA were negative at his visit last month.  Reviewed that his risk of acquiring HIV is lower now given he is not sharing needles with his partner and as it sounds like his partner is well-controlled on HIV treatment. Discussed U = U. Encouraged condom use. He states he does not use condoms and is always the top partner. He also participates in oral sex. Denies any other sexual partners. Will check HIV antibody and hepatitis A surface antibody today; all other labs  updated in September.  Reviewed his PrEP options. He would prefer taking a daily pill. Truvada is covered 100% through his insurance. He would like it mailed [facility]; address is updated in [place]. Counseled patient that Truvada is a one pill once daily regimen with or without food that can prevent HIV. Discussed the importance of taking the medication daily to provide protection and decreased adherence is associated with decreased efficacy. Also discussed how Truvada works to prevent HIV but not other STDs. Counseled on what to do if dose is missed, if closer to missed dose take immediately, if closer to next dose then skip and resume normal schedule.Counseled patient that Truvada is normally well tolerated, however some patients experience a "start up syndrome" with nausea, diarrhea, dizziness, and fatigue but that those should resolve soon after starting. Reviewed potential adverse events such as nephrotoxicity and bone density loss with Truvada. Advised that any nausea can be mitigated by taking it with food. I reviewed patient medications and found no drug interactions. Discussed how our PrEP process works here at the clinic including follow ups and lab monitoring every 3 months.  Patient due for annual flu vaccination which was administered today. Reviewed monkeypox vaccine. He is relatively low risk; he deferred series today.  Plan: Check HIV antibody and HAV sAb Start Truvada if HIV antibody negative Follow up with me on 12/26/22 at 10:00  Alfonse Spruce, PharmD, CPP,  [facility] 10/01/2022, 10:51 AM

## 2022-10-01 NOTE — Assessment & Plan Note (Signed)
With reported difficulty ambulating Recent imagine with stable Lumbar disease Exam normal, no concern for cauda equina  Referral to NS

## 2022-10-01 NOTE — Assessment & Plan Note (Signed)
Follows with [facility] Stable
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
## 2022-10-01 NOTE — Assessment & Plan Note (Signed)
Referral to Addiction Medicine

## 2022-10-01 NOTE — Patient Instructions (Signed)
For substance use, we are referring to Addiction Services.  For lower back pain, we are referring to Neurosurgery  The office will call to schedule  the appointments

## 2022-10-01 NOTE — Assessment & Plan Note (Signed)
Resolved

## 2022-10-02 ENCOUNTER — Other Ambulatory Visit (HOSPITAL_COMMUNITY): Payer: Self-pay

## 2022-10-02 ENCOUNTER — Other Ambulatory Visit: Payer: Self-pay | Admitting: Pharmacist

## 2022-10-02 DIAGNOSIS — Z79899 Other long term (current) drug therapy: Secondary | ICD-10-CM

## 2022-10-02 LAB — HIV ANTIBODY (ROUTINE TESTING W REFLEX): HIV 1&2 Ab, 4th Generation: NONREACTIVE

## 2022-10-02 LAB — HEPATITIS A ANTIBODY, TOTAL: Hepatitis A AB,Total: NONREACTIVE

## 2022-10-02 MED ORDER — EMTRICITABINE-TENOFOVIR DF 200-300 MG PO TABS
1.0000 | ORAL_TABLET | Freq: Every day | ORAL | 2 refills | Status: DC
  Filled 2022-10-02: qty 30, 30d supply, fill #0

## 2022-10-05 ENCOUNTER — Other Ambulatory Visit: Payer: Self-pay

## 2022-10-08 ENCOUNTER — Other Ambulatory Visit (HOSPITAL_COMMUNITY): Payer: Self-pay

## 2022-10-22 ENCOUNTER — Encounter (HOSPITAL_COMMUNITY): Payer: No Payment, Other | Admitting: Psychiatry
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
## 2022-10-27 ENCOUNTER — Other Ambulatory Visit (HOSPITAL_COMMUNITY): Payer: Self-pay

## 2022-10-29 ENCOUNTER — Other Ambulatory Visit (HOSPITAL_COMMUNITY): Payer: Self-pay

## 2022-11-02 ENCOUNTER — Other Ambulatory Visit (HOSPITAL_COMMUNITY): Payer: Self-pay

## 2022-11-23 ENCOUNTER — Ambulatory Visit (INDEPENDENT_AMBULATORY_CARE_PROVIDER_SITE_OTHER): Payer: Commercial Managed Care - HMO | Admitting: Nurse Practitioner

## 2022-11-23 ENCOUNTER — Encounter: Payer: Self-pay | Admitting: Nurse Practitioner

## 2022-11-23 VITALS — BP 119/70 | Temp 93.0°F | Wt 226.6 lb

## 2022-11-23 DIAGNOSIS — F1210 Cannabis abuse, uncomplicated: Secondary | ICD-10-CM

## 2022-11-23 DIAGNOSIS — F1520 Other stimulant dependence, uncomplicated: Secondary | ICD-10-CM

## 2022-11-23 DIAGNOSIS — Z720 Tobacco use: Secondary | ICD-10-CM | POA: Insufficient documentation

## 2022-11-23 DIAGNOSIS — Z23 Encounter for immunization: Secondary | ICD-10-CM

## 2022-11-23 DIAGNOSIS — Z79899 Other long term (current) drug therapy: Secondary | ICD-10-CM | POA: Diagnosis not present

## 2022-11-23 DIAGNOSIS — F3162 Bipolar disorder, current episode mixed, moderate: Secondary | ICD-10-CM

## 2022-11-23 DIAGNOSIS — F17200 Nicotine dependence, unspecified, uncomplicated: Secondary | ICD-10-CM
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
## 2022-11-23 NOTE — Patient Instructions (Signed)

## 2022-11-23 NOTE — Assessment & Plan Note (Addendum)
Patient referred to [facility] for management of his his chronic mental health disorders.  Continue Abilify 5 mg daily, hydroxyzine 25 mg 3 times daily as needed, Zoloft 25 mg daily, trazodone 50 mg daily. Patient currently denies SI, HI LCSW was consulted today , patient has upcoming appointment with her for counseling.

## 2022-11-23 NOTE — Assessment & Plan Note (Addendum)
Smokes about 1 pack/day need to quit smoking tobacco including risk of lung cancer, COPD, heart disease we discussed with patient today.  Follow-up in 3 months

## 2022-11-23 NOTE — Assessment & Plan Note (Signed)
Currently followed by infectious disease specialists Continue Truvada 200-300 mg 1 tablet daily Patient was encouraged to maintain close follow-up with ID

## 2022-11-23 NOTE — Assessment & Plan Note (Signed)
Need to avoid smoking marijuana was discussed with the patient today

## 2022-11-23 NOTE — Assessment & Plan Note (Signed)
Patient referred to [facility] for substance use disorder He was in rehab back in September 2023 Need to avoid use of drugs and maintain  methamphentermine including risk of overdose, death discussed with patient he verbalized understanding

## 2022-11-23 NOTE — Progress Notes (Signed)
Integrated Behavioral Health Case Management Referral Note  11/23/2022 Name: Burech Mcfarland MRN: [id number] DOB: 03-Oct-1986 Tamotsu Wiederholt is a 36 y.o. year old male who sees Paseda, Baird Kay, FNP for primary care. LCSW was consulted to assess patient's needs and assist the patient with Mental Health Counseling and Resources.  Interpreter: No.   Interpreter Name & Language: none  Assessment: Patient experiencing mental health concerns, substance abuse and homelessness.  Intervention: Patient is accompanied by his friend, Marylene Land, at today's appointment. Patient experiencing homelessness, sleeps in a tent. He has also relapsed - previously in recovery. PCP has referred to psychiatry.  Scheduled Integrated Behavioral Health (IBH) appointment for 12/14. Will also plan to complete [employer] [place] application with patient at this appointment, as he uses a walker.  Review of patient status, including review of consultants reports, relevant laboratory and other test results, and collaboration with appropriate care team members and the patient's provider was performed as part of comprehensive patient evaluation and provision of services.    Abigail Butts, LCSW [facility] [phone]

## 2022-11-24 ENCOUNTER — Other Ambulatory Visit: Payer: Self-pay

## 2022-11-25 ENCOUNTER — Telehealth: Payer: Self-pay | Admitting: Nurse Practitioner

## 2022-11-25 ENCOUNTER — Other Ambulatory Visit: Payer: Self-pay | Admitting: Nurse Practitioner

## 2022-11-25 ENCOUNTER — Other Ambulatory Visit: Payer: Self-pay

## 2022-11-25 DIAGNOSIS — F3162 Bipolar disorder, current episode mixed, moderate: Secondary | ICD-10-CM

## 2022-11-25 DIAGNOSIS — F3130 Bipolar disorder, current episode depressed, mild or moderate severity, unspecified: Secondary | ICD-10-CM

## 2022-11-25 DIAGNOSIS — G8929 Other chronic pain: Secondary | ICD-10-CM

## 2022-11-25 DIAGNOSIS — F1994 Other psychoactive substance use, unspecified with psychoactive substance-induced mood disorder: Secondary | ICD-10-CM

## 2022-11-25 DIAGNOSIS — F5105 Insomnia due to other mental disorder: Secondary | ICD-10-CM

## 2022-11-25 MED ORDER — ARIPIPRAZOLE 5 MG PO TABS
5.0000 mg | ORAL_TABLET | Freq: Every day | ORAL | 3 refills | Status: DC
  Filled 2022-11-25 – 2023-01-27 (×3): qty 30, 30d supply, fill #0
  Filled 2023-02-24: qty 30, 30d supply, fill #1
  Filled 2023-02-26 – 2023-07-16 (×3): qty 30, 30d supply, fill #2
  Filled 2023-09-07: qty 30, 30d supply, fill #3

## 2022-11-25 MED ORDER — GABAPENTIN 100 MG PO CAPS
ORAL_CAPSULE | ORAL | 1 refills | Status: DC
  Filled 2022-11-25: qty 180, 30d supply, fill #0
  Filled 2022-12-24 – 2023-01-27 (×2): qty 180, 30d supply, fill #1

## 2022-11-25 MED ORDER — SERTRALINE HCL 25 MG PO TABS
ORAL_TABLET | ORAL | 3 refills | Status: DC
  Filled 2022-11-25: qty 90, 30d supply, fill #0
  Filled 2022-12-24 – 2023-01-27 (×2): qty 90, 30d supply, fill #1
  Filled 2023-02-24: qty 90, 30d supply, fill #2
  Filled 2023-07-16 (×2): qty 90, 30d supply, fill #3

## 2022-11-25 MED ORDER — TRAZODONE HCL 50 MG PO TABS
50.0000 mg | ORAL_TABLET | Freq: Every day | ORAL | 0 refills | Status: DC
  Filled 2022-11-25: qty 30, 30d supply, fill #0
  Filled 2022-12-24 – 2023-01-27 (×2): qty 30, 30d supply, fill #1
  Filled 2023-02-24: qty 30, 30d supply, fill #2

## 2022-11-25 MED ORDER — HYDROXYZINE HCL 25 MG PO TABS
25.0000 mg | ORAL_TABLET | Freq: Three times a day (TID) | ORAL | 3 refills | Status: DC | PRN
  Filled 2022-11-25 – 2023-01-27 (×3): qty 30, 10d supply, fill #0
  Filled 2023-02-24: qty 30, 10d supply, fill #1
  Filled 2023-02-26 – 2023-07-16 (×3): qty 30, 10d supply, fill #2
  Filled 2023-09-07: qty 30, 10d supply, fill #3

## 2022-11-25 NOTE — Telephone Encounter (Signed)
Case Manager called asking about medication refills. She stated that she and him are both confused as to where the medications have been sent/ if they have been sent at all. She states urgent pt gets the meds refilled.   Please call her back at [phone]

## 2022-11-25 NOTE — Telephone Encounter (Signed)
I have called them about the medication refill, left voice message as they did not answer their call. Medications sent to [facility] [address], [place] [address] Phone: [phone]  Fax: [phone]  They will need to contact ID office for refill of his antiviral medication if needed. Thanks  DEA #: [id number]

## 2022-11-26 NOTE — Telephone Encounter (Signed)
Sent a my chart message. KH 

## 2022-12-03 ENCOUNTER — Ambulatory Visit (INDEPENDENT_AMBULATORY_CARE_PROVIDER_SITE_OTHER): Payer: Commercial Managed Care - HMO | Admitting: Clinical

## 2022-12-03 DIAGNOSIS — F3130 Bipolar disorder, current episode depressed, mild or moderate severity, unspecified: Secondary | ICD-10-CM | POA: Diagnosis not present

## 2022-12-03 DIAGNOSIS — F1520 Other stimulant dependence, uncomplicated: Secondary | ICD-10-CM

## 2022-12-03 NOTE — BH Specialist Note (Signed)
Integrated Behavioral Health Initial In-Person Visit  MRN: [id number] Name: Edward Wolfe  Number of Integrated Behavioral Health Clinician visits: 1- Initial Visit  Session Start time: 1415    Session End time: 1515  Total time in minutes: 60   Types of Service: Individual psychotherapy  Interpretor:No. Interpretor Name and Language: none  Subjective: Edward Wolfe is a 36 y.o. male accompanied by  self. Patient was referred by Edwin Dada, NP for depression, substance use. Patient reports the following symptoms/concerns: depression, substance abuse Duration of problem: several years; Severity of problem: severe  Objective: Mood: Euthymic and Affect: Appropriate Risk of harm to self or others: No plan to harm self or others  Life Context: Family and Social: lives with partner, they sleep outside in a tent due to homelessness School/Work: unable to work due to health conditions Self-Care:  Life Changes: lost apartment, cycle of recovery and relapse with substance use  Patient and/or Family's Strengths/Protective Factors: Social connections and Sense of purpose  Goals Addressed: Patient will: Reduce symptoms of: depression Increase knowledge and/or ability of: coping skills and self-management skills  Demonstrate ability to: Increase healthy adjustment to current life circumstances, Increase adequate support systems for patient/family, and Decrease self-medicating behaviors  Progress towards Goals: Ongoing  Interventions: Interventions utilized: Supportive Counseling and Link to [facility]  Standardized Assessments completed: Not Needed  Assessment and supportive counseling today. Patient experiencing substance abuse disorder - methamphetamine use - and depression. He went to rehab in August but has since relapsed due to social and housing situation. He lives with a partner who continues to use. He experiences depression related to current situation.  He endorses thoughts of suicide, though denies a plan or intent to harm himself. He is future oriented.  Discussed goals for therapy. Patient would like to move into recovery again and wants to find permanent housing.   Completed [employer] [place] application and submitted to [facility].  Patient and/or Family Response: Patient engaged in session.   Patient Centered Plan: Patient is on the following Treatment Plan(s):  CBT for depression, connection with community resources for rehab  Assessment: Patient currently experiencing depression and substance use. He is also experiencing homelessness.   Patient may benefit from CBT to explore thoughts about self and the world and process emotions in the context of depression. He would also benefit from connection to community resources.   Plan: Follow up with behavioral health clinician on: 12/16/22 Referral(s): Integrated [employer] (In Clinic)  Abigail Butts, LCSW

## 2022-12-16 ENCOUNTER — Ambulatory Visit (INDEPENDENT_AMBULATORY_CARE_PROVIDER_SITE_OTHER): Payer: No Payment, Other | Admitting: Clinical

## 2022-12-16 DIAGNOSIS — F1520 Other stimulant dependence, uncomplicated: Secondary | ICD-10-CM

## 2022-12-16 DIAGNOSIS — F3130 Bipolar disorder, current episode depressed, mild or moderate severity, unspecified: Secondary | ICD-10-CM | POA: Diagnosis not present

## 2022-12-16 NOTE — BH Specialist Note (Signed)
    Integrated Behavioral Health Follow Up In-Person Visit  MRN: [id number] Name: Edward Wolfe  Number of Integrated Behavioral Health Clinician visits: 2- Second Visit  Session Start time: 1400   Session End time: 1450  Total time in minutes: 50   Types of Service: Individual psychotherapy  Interpretor:No. Interpretor Name and Language: none  Subjective: Edward Wolfe is a 36 y.o. male accompanied by  self. Patient was referred by Edwin Dada, NP for depression, substance use. Patient reports the following symptoms/concerns: depression, substance abuse Duration of problem: several years; Severity of problem: severe  Objective: Mood: Euthymic and Affect: Appropriate Risk of harm to self or others: No plan to harm self or others  Patient and/or Family's Strengths/Protective Factors: Social connections, Social and Emotional competence, and Sense of purpose  Goals Addressed: Patient will: Reduce symptoms of: depression Increase knowledge and/or ability of: coping skills and self-management skills  Demonstrate ability to: Increase healthy adjustment to current life circumstances, Increase adequate support systems for patient/family, and Decrease self-medicating behaviors  Progress towards Goals: Ongoing  Interventions: Interventions utilized:  Supportive Counseling Standardized Assessments completed: Not Needed  Supportive counseling today to process motivation to engage in substance use treatment. Patient is very motivated, though faces some barriers such as finances and housing. He currently is relying on his partner for income and housing, but partner is also experiencing substance use disorder. If patient goes to treatment and then returns to living with partner, this would likely be a barrier to his ongoing recovery. Reflective listening and processing around this today.   Also advised patient to complete a walk-in appointment at [facility]) for psychiatry and substance use support. [facility] does offer detox, which patient will need prior to entering a residential treatment center, as some residential centers do not offer detox.   Patient and/or Family Response: Patient engaged in session.   Assessment: Patient currently experiencing depression and substance use. He is also experiencing homelessness.   Patient may benefit from CBT to explore thoughts about self and the world and process emotions in the context of depression. He would also benefit from connection to community resources.  Plan: Follow up with behavioral health clinician on: 12/30/22 Referral(s): Psychiatrist  Abigail Butts, LCSW

## 2022-12-24 ENCOUNTER — Other Ambulatory Visit: Payer: Self-pay

## 2022-12-30 ENCOUNTER — Ambulatory Visit: Payer: No Payment, Other | Admitting: Clinical

## 2022-12-31 ENCOUNTER — Other Ambulatory Visit: Payer: Self-pay

## 2023-01-01 ENCOUNTER — Other Ambulatory Visit (HOSPITAL_COMMUNITY): Payer: Self-pay

## 2023-01-05 ENCOUNTER — Ambulatory Visit: Payer: Commercial Managed Care - HMO | Admitting: Pharmacist

## 2023-01-27 ENCOUNTER — Other Ambulatory Visit: Payer: Self-pay

## 2023-01-29 ENCOUNTER — Other Ambulatory Visit: Payer: Self-pay

## 2023-02-04 ENCOUNTER — Ambulatory Visit: Payer: Self-pay | Admitting: Clinical

## 2023-02-22 ENCOUNTER — Ambulatory Visit: Payer: Self-pay | Admitting: Nurse Practitioner

## 2023-02-24 ENCOUNTER — Other Ambulatory Visit: Payer: Self-pay

## 2023-02-25 ENCOUNTER — Other Ambulatory Visit: Payer: Self-pay

## 2023-02-25 ENCOUNTER — Other Ambulatory Visit (HOSPITAL_COMMUNITY): Payer: Self-pay

## 2023-02-25 ENCOUNTER — Other Ambulatory Visit: Payer: Self-pay | Admitting: Nurse Practitioner

## 2023-02-25 DIAGNOSIS — M545 Low back pain, unspecified: Secondary | ICD-10-CM

## 2023-02-25 NOTE — Telephone Encounter (Signed)
Please advise KH 

## 2023-02-26 ENCOUNTER — Other Ambulatory Visit: Payer: Self-pay | Admitting: Nurse Practitioner

## 2023-02-26 ENCOUNTER — Other Ambulatory Visit: Payer: Self-pay

## 2023-02-26 ENCOUNTER — Other Ambulatory Visit (HOSPITAL_COMMUNITY): Payer: Self-pay

## 2023-02-26 ENCOUNTER — Telehealth: Payer: Self-pay

## 2023-02-26 DIAGNOSIS — F5105 Insomnia due to other mental disorder: Secondary | ICD-10-CM

## 2023-02-26 DIAGNOSIS — G8929 Other chronic pain: Secondary | ICD-10-CM

## 2023-02-26 MED ORDER — TRAZODONE HCL 50 MG PO TABS
50.0000 mg | ORAL_TABLET | Freq: Every day | ORAL | 0 refills | Status: DC
  Filled 2023-02-26: qty 90, 90d supply, fill #0
  Filled 2023-07-16 (×2): qty 30, 30d supply, fill #0
  Filled 2023-09-07: qty 30, 30d supply, fill #1
  Filled 2023-11-01: qty 30, 30d supply, fill #2

## 2023-02-26 MED ORDER — GABAPENTIN 100 MG PO CAPS
200.0000 mg | ORAL_CAPSULE | Freq: Three times a day (TID) | ORAL | 3 refills | Status: DC
  Filled 2023-02-26 – 2023-07-16 (×3): qty 180, 30d supply, fill #0
  Filled 2023-09-07: qty 180, 30d supply, fill #1
  Filled 2023-11-01: qty 180, 30d supply, fill #2

## 2023-02-26 NOTE — Telephone Encounter (Signed)
Please advise KH 

## 2023-03-01 ENCOUNTER — Ambulatory Visit: Payer: Self-pay | Admitting: Nurse Practitioner

## 2023-03-03 ENCOUNTER — Other Ambulatory Visit (HOSPITAL_COMMUNITY): Payer: Self-pay

## 2023-03-03 NOTE — Telephone Encounter (Signed)
Per provider medication was sent in to pt pharmacy. [place]

## 2023-03-04 ENCOUNTER — Other Ambulatory Visit: Payer: Self-pay

## 2023-04-02 ENCOUNTER — Telehealth: Payer: Self-pay | Admitting: Family Medicine

## 2023-04-02 ENCOUNTER — Encounter: Payer: 59 | Admitting: Family Medicine

## 2023-04-02 NOTE — Telephone Encounter (Signed)
4.12.24 no show letter sent

## 2023-04-02 NOTE — Telephone Encounter (Signed)
1st no show, fee waived, letter sent to reschedule/notify of policy 

## 2023-07-16 ENCOUNTER — Other Ambulatory Visit: Payer: Self-pay

## 2023-07-16 ENCOUNTER — Other Ambulatory Visit (HOSPITAL_COMMUNITY): Payer: Self-pay

## 2023-07-19 ENCOUNTER — Other Ambulatory Visit: Payer: Self-pay

## 2023-07-19 ENCOUNTER — Other Ambulatory Visit (HOSPITAL_COMMUNITY): Payer: Self-pay

## 2023-08-12 ENCOUNTER — Telehealth: Payer: Self-pay | Admitting: Family Medicine

## 2023-08-12 NOTE — Telephone Encounter (Signed)
This pt called and needed a copy of his Drivers License and asked for a signature HOWEVER no provider will sign the copy... SO I just STAMP it.  Its in the pick up bind by my desk under the M's

## 2023-08-17 ENCOUNTER — Ambulatory Visit: Payer: Self-pay | Admitting: Nurse Practitioner

## 2023-08-25 ENCOUNTER — Ambulatory Visit: Payer: Self-pay | Admitting: Nurse Practitioner

## 2023-09-07 ENCOUNTER — Other Ambulatory Visit: Payer: Self-pay

## 2023-09-07 ENCOUNTER — Other Ambulatory Visit: Payer: Self-pay | Admitting: Nurse Practitioner

## 2023-09-07 DIAGNOSIS — F1994 Other psychoactive substance use, unspecified with psychoactive substance-induced mood disorder: Secondary | ICD-10-CM

## 2023-09-07 DIAGNOSIS — F3130 Bipolar disorder, current episode depressed, mild or moderate severity, unspecified: Secondary | ICD-10-CM

## 2023-09-07 MED ORDER — SERTRALINE HCL 25 MG PO TABS
ORAL_TABLET | ORAL | 0 refills | Status: DC
  Filled 2023-09-07: qty 90, 30d supply, fill #0

## 2023-09-07 NOTE — Telephone Encounter (Signed)
Called pt but no answer.

## 2023-09-13 ENCOUNTER — Other Ambulatory Visit: Payer: Self-pay

## 2023-09-17 ENCOUNTER — Ambulatory Visit: Payer: Self-pay | Admitting: Nurse Practitioner

## 2023-09-21 IMAGING — MR MR LUMBAR SPINE WO/W CM
4 of 7 series · 23 of 48 positions shown · IV contrast (gadavist)
Comparison: None Available.

CLINICAL DATA: Epidural abscess suspected. Bilateral leg weakness,
decreased sensation in the right lower leg, and falls. Low back
pain. History of IV drug use and HIV.

EXAM:
MRI LUMBAR SPINE WITHOUT AND WITH CONTRAST
TECHNIQUE: Multiplanar and multiecho pulse sequences of the lumbar spine were
obtained without and with intravenous contrast.
CONTRAST:  9.5mL GADAVIST GADOBUTROL 1 MMOL/ML IV SOLN

[Series 5: T2 · sagittal · 4.0mm · 0.73mm/px · 5 of 17 slices shown (1 of 2)]
[im 1/17]
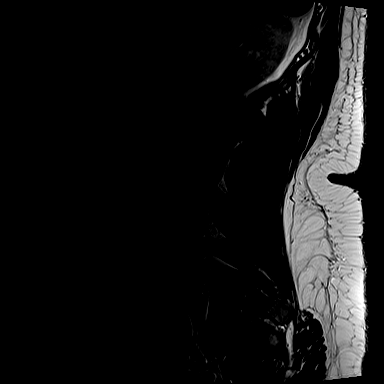
[im 5/17]
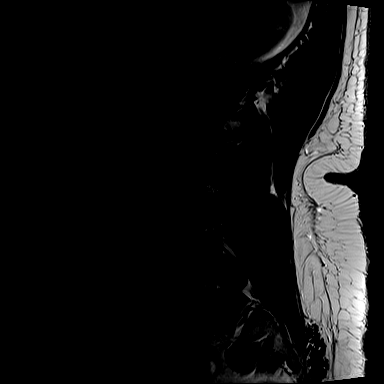
[im 9/17]
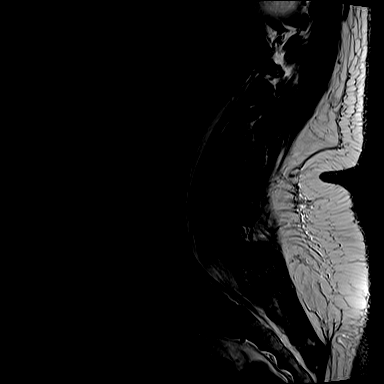
[im 13/17]
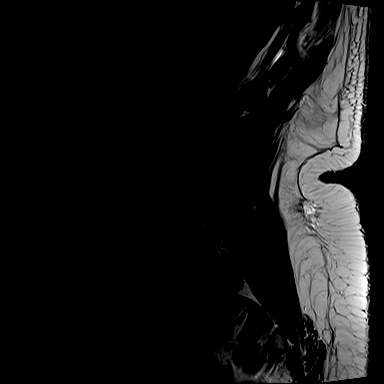
[im 17/17]
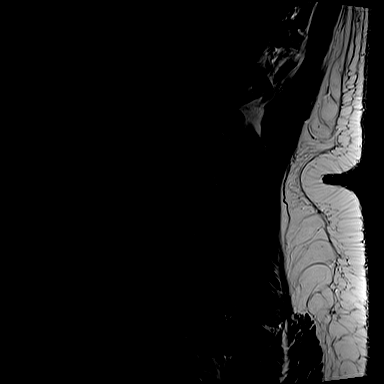

[Series 7: T1 · sagittal · 4.0mm · 0.88mm/px · 4 of 17 slices shown (1 of 2)]
[im 1/17]
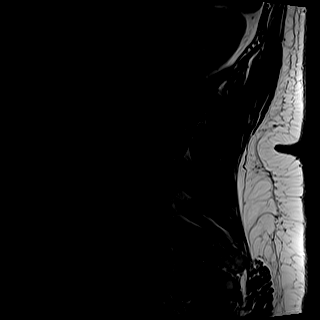
[im 6/17]
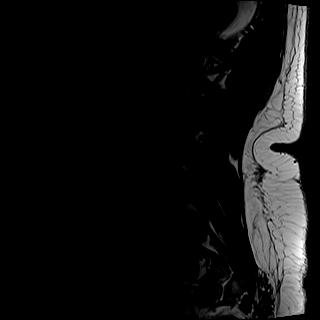
[im 11/17]
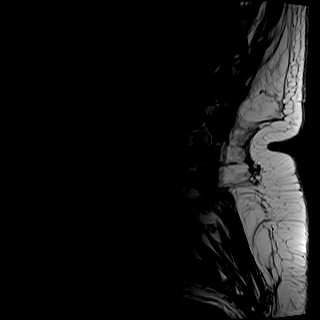
[im 17/17]
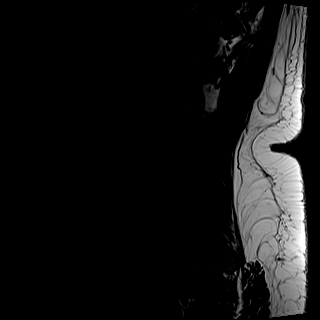

[Series 9: T1 · axial · 4.0mm · 0.34mm/px · z∈[-151,+72]mm · 6 of 39 slices shown (2 of 2)]
[im 1/39]
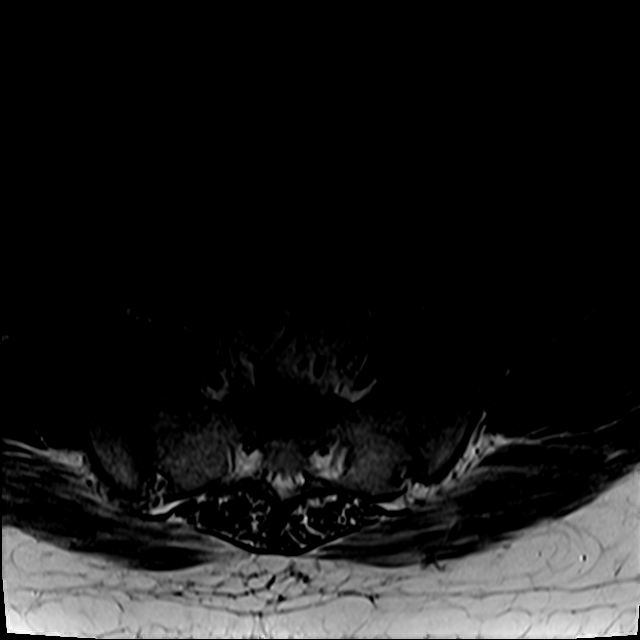
[im 5/39]
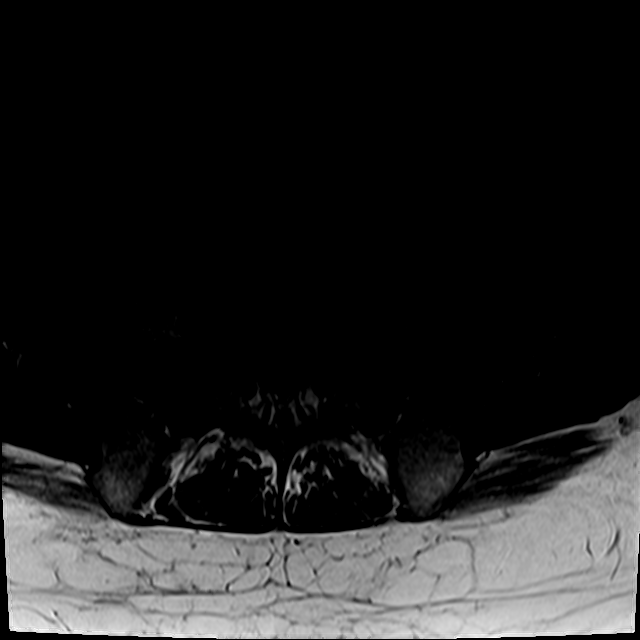
[im 13/39]
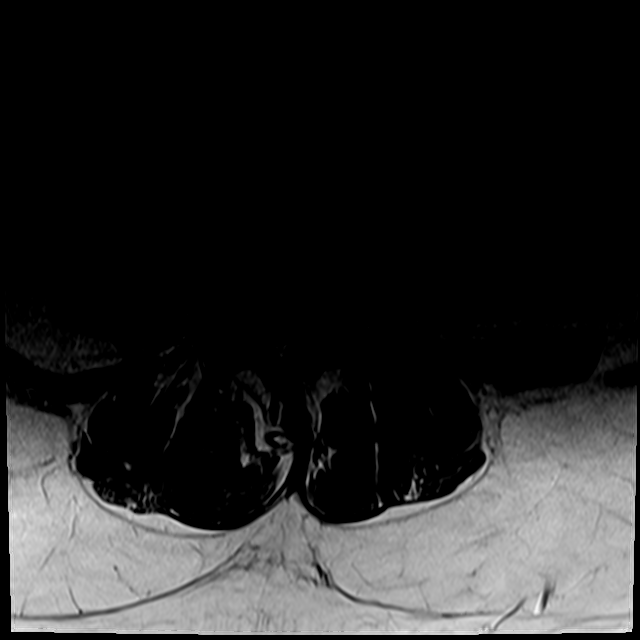
[im 17/39]
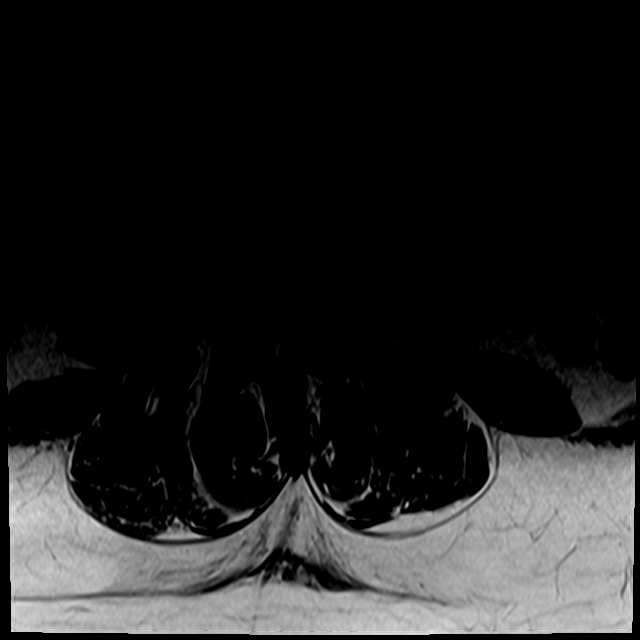
[im 22/39]
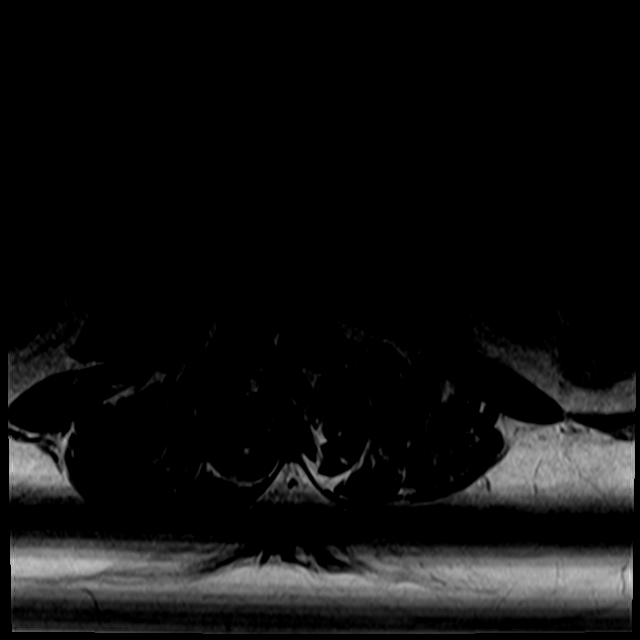
[im 34/39]
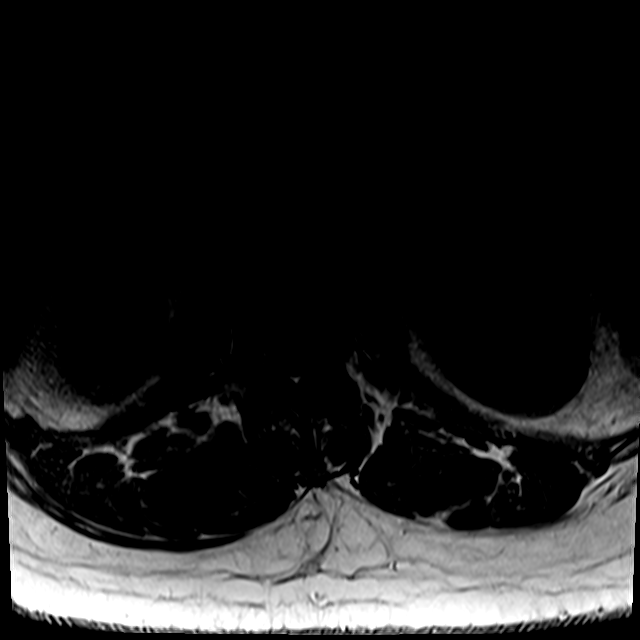

[Series 10: T2 · axial · 4.0mm · 0.57mm/px · z∈[-151,+96]mm · 8 of 39 slices shown (2 of 2)]
[im 1/39]
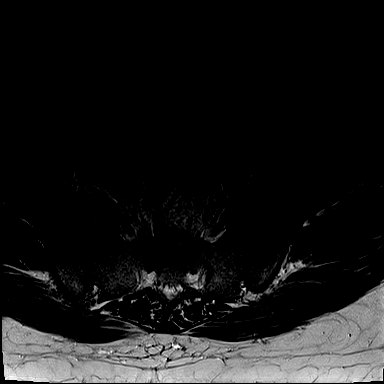
[im 5/39]
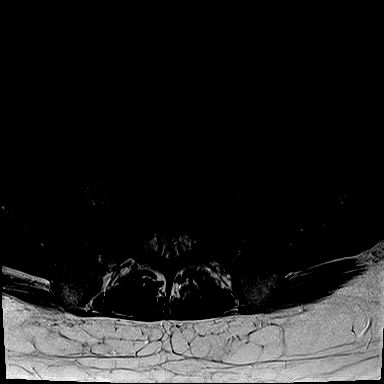
[im 13/39]
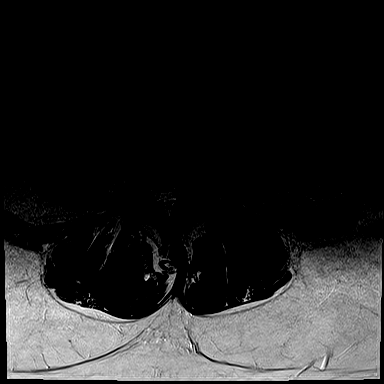
[im 17/39]
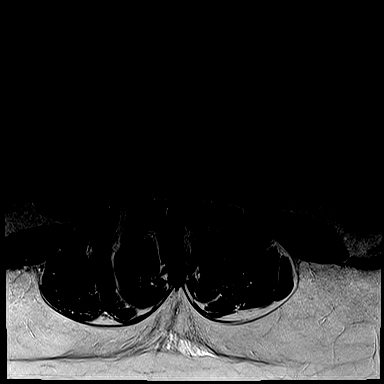
[im 22/39]
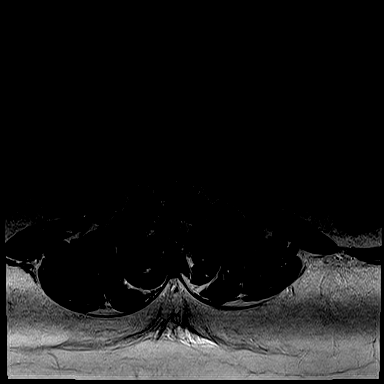
[im 26/39]
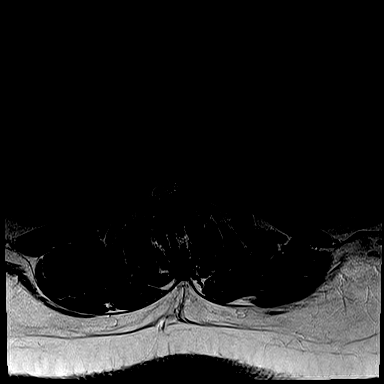
[im 34/39]
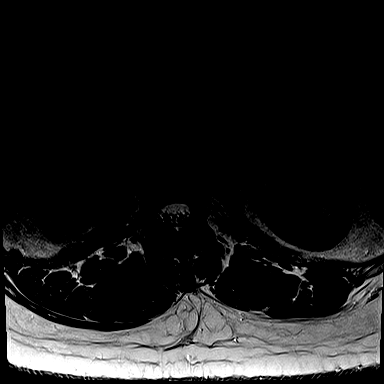
[im 39/39]
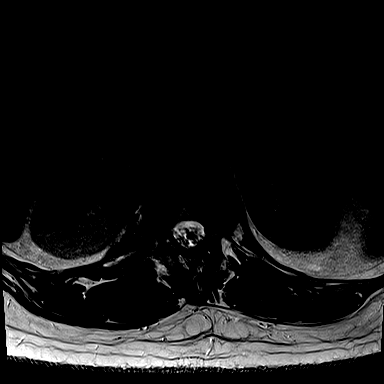

[23 of 48 positions shown; findings below may reference images not displayed]

FINDINGS: Segmentation:  Standard.

Alignment: Mild-to-moderate lumbar dextroscoliosis. Increased lumbar
lordosis. Bilateral L5 pars defects with 4 mm anterolisthesis of L5
on S1.

Vertebrae: No lumbar spine fracture, suspicious marrow lesion,
significant marrow edema, or evidence of discitis. Mild chronic
anterior wedging of the T11 vertebral body.

Conus medullaris and cauda equina: Conus extends to the T12 level.
Conus and cauda equina appear normal.

Paraspinal and other soft tissues: No epidural or paraspinal fluid
collection.

Disc levels:

T12-L1: Normal disc. Mild facet and ligamentum flavum hypertrophy
without stenosis.

L1-2: Normal disc. Mild facet and ligamentum flavum hypertrophy
without stenosis.

L2-3: Normal disc. Moderate to severe right and mild left facet
hypertrophy without stenosis.

L3-4: Minimal disc bulging and moderate facet and ligamentum flavum
hypertrophy without stenosis.

L4-5: Mild-to-moderate facet and ligamentum flavum hypertrophy
without stenosis.

L5-S1: Disc desiccation and mild disc space narrowing.
Anterolisthesis with left eccentric bulging of uncovered disc result
in mild right and moderate left neural foraminal stenosis without
spinal stenosis.
IMPRESSION: 1. No evidence of infection in the lumbar spine.
2. L5 pars defects with grade 1 anterolisthesis and mild right and
moderate left neural foraminal stenosis.
3. Diffuse facet hypertrophy.
4. No spinal stenosis.

## 2023-10-19 ENCOUNTER — Other Ambulatory Visit: Payer: Self-pay | Admitting: Nurse Practitioner

## 2023-10-19 ENCOUNTER — Other Ambulatory Visit: Payer: Self-pay

## 2023-10-19 DIAGNOSIS — F3130 Bipolar disorder, current episode depressed, mild or moderate severity, unspecified: Secondary | ICD-10-CM

## 2023-10-19 DIAGNOSIS — F1994 Other psychoactive substance use, unspecified with psychoactive substance-induced mood disorder: Secondary | ICD-10-CM

## 2023-10-19 DIAGNOSIS — F3162 Bipolar disorder, current episode mixed, moderate: Secondary | ICD-10-CM

## 2023-10-19 DIAGNOSIS — F5105 Insomnia due to other mental disorder: Secondary | ICD-10-CM

## 2023-10-19 MED ORDER — HYDROXYZINE HCL 25 MG PO TABS
25.0000 mg | ORAL_TABLET | Freq: Three times a day (TID) | ORAL | 3 refills | Status: DC | PRN
  Filled 2023-10-19 – 2023-11-01 (×2): qty 30, 10d supply, fill #0

## 2023-10-19 MED ORDER — ARIPIPRAZOLE 5 MG PO TABS
5.0000 mg | ORAL_TABLET | Freq: Every day | ORAL | 1 refills | Status: DC
  Filled 2023-10-19 – 2023-11-01 (×2): qty 30, 30d supply, fill #0

## 2023-10-19 MED ORDER — SERTRALINE HCL 25 MG PO TABS
75.0000 mg | ORAL_TABLET | Freq: Every day | ORAL | 0 refills | Status: DC
  Filled 2023-10-19: qty 90, 30d supply, fill #0

## 2023-10-19 NOTE — Telephone Encounter (Signed)
Caller & Relationship to patient:  MRN #  [id number]   Call Back Number:   Date of Last Office Visit: 09/17/2023     Date of Next Office Visit: 10/20/2023    Medication(s) to be Refilled: Bi polar med's  Preferred Pharmacy:   ** Please notify patient to allow 48-72 hours to process** **Let patient know to contact pharmacy at the end of the day to make sure medication is ready. ** **If patient has not been seen in a year or longer, book an appointment **Advise to use MyChart for refill requests OR to contact their pharmacy

## 2023-10-19 NOTE — Telephone Encounter (Signed)
Please advise KH 

## 2023-10-20 ENCOUNTER — Ambulatory Visit: Payer: Self-pay | Admitting: Nurse Practitioner

## 2023-10-29 ENCOUNTER — Other Ambulatory Visit: Payer: Self-pay

## 2023-11-01 ENCOUNTER — Encounter: Payer: Self-pay | Admitting: Nurse Practitioner

## 2023-11-01 ENCOUNTER — Ambulatory Visit (INDEPENDENT_AMBULATORY_CARE_PROVIDER_SITE_OTHER): Payer: 59 | Admitting: Nurse Practitioner

## 2023-11-01 ENCOUNTER — Other Ambulatory Visit (HOSPITAL_COMMUNITY): Payer: Self-pay

## 2023-11-01 VITALS — BP 119/71 | HR 83 | Temp 97.5°F | Wt 224.0 lb

## 2023-11-01 DIAGNOSIS — Z1329 Encounter for screening for other suspected endocrine disorder: Secondary | ICD-10-CM | POA: Diagnosis not present

## 2023-11-01 DIAGNOSIS — F17200 Nicotine dependence, unspecified, uncomplicated: Secondary | ICD-10-CM

## 2023-11-01 DIAGNOSIS — Z1321 Encounter for screening for nutritional disorder: Secondary | ICD-10-CM

## 2023-11-01 DIAGNOSIS — F5105 Insomnia due to other mental disorder: Secondary | ICD-10-CM | POA: Diagnosis not present

## 2023-11-01 DIAGNOSIS — F99 Mental disorder, not otherwise specified: Secondary | ICD-10-CM

## 2023-11-01 DIAGNOSIS — F3162 Bipolar disorder, current episode mixed, moderate: Secondary | ICD-10-CM

## 2023-11-01 DIAGNOSIS — F1994 Other psychoactive substance use, unspecified with psychoactive substance-induced mood disorder: Secondary | ICD-10-CM | POA: Diagnosis not present

## 2023-11-01 DIAGNOSIS — F3130 Bipolar disorder, current episode depressed, mild or moderate severity, unspecified: Secondary | ICD-10-CM | POA: Diagnosis not present

## 2023-11-01 DIAGNOSIS — M545 Low back pain, unspecified: Secondary | ICD-10-CM

## 2023-11-01 DIAGNOSIS — G8929 Other chronic pain: Secondary | ICD-10-CM

## 2023-11-01 DIAGNOSIS — Z789 Other specified health status: Secondary | ICD-10-CM

## 2023-11-01 DIAGNOSIS — Z13228 Encounter for screening for other metabolic disorders: Secondary | ICD-10-CM | POA: Diagnosis not present

## 2023-11-01 DIAGNOSIS — Z130 Encounter for screening for diseases of the blood and blood-forming organs and certain disorders involving the immune mechanism: Secondary | ICD-10-CM | POA: Diagnosis not present

## 2023-11-01 DIAGNOSIS — R269 Unspecified abnormalities of gait and mobility: Secondary | ICD-10-CM | POA: Diagnosis not present

## 2023-11-01 DIAGNOSIS — F1520 Other stimulant dependence, uncomplicated: Secondary | ICD-10-CM | POA: Diagnosis not present

## 2023-11-01 DIAGNOSIS — Z23 Encounter for immunization: Secondary | ICD-10-CM | POA: Diagnosis not present

## 2023-11-01 DIAGNOSIS — F419 Anxiety disorder, unspecified: Secondary | ICD-10-CM | POA: Diagnosis not present

## 2023-11-01 DIAGNOSIS — Z5982 Transportation insecurity: Secondary | ICD-10-CM

## 2023-11-01 DIAGNOSIS — Z7409 Other reduced mobility: Secondary | ICD-10-CM

## 2023-11-01 MED ORDER — HYDROXYZINE HCL 25 MG PO TABS
25.0000 mg | ORAL_TABLET | Freq: Three times a day (TID) | ORAL | 3 refills | Status: DC | PRN
  Filled 2023-11-01 – 2023-11-25 (×3): qty 30, 10d supply, fill #0
  Filled 2023-12-21: qty 30, 10d supply, fill #1
  Filled 2024-03-02 (×4): qty 30, 10d supply, fill #2
  Filled 2024-04-04 – 2024-04-12 (×2): qty 30, 10d supply, fill #3

## 2023-11-01 MED ORDER — TRAZODONE HCL 50 MG PO TABS
50.0000 mg | ORAL_TABLET | Freq: Every day | ORAL | 0 refills | Status: DC
  Filled 2023-11-01: qty 90, 90d supply, fill #0
  Filled 2023-11-24 – 2023-11-25 (×2): qty 30, 30d supply, fill #0
  Filled 2023-12-21: qty 30, 30d supply, fill #1

## 2023-11-01 MED ORDER — SERTRALINE HCL 100 MG PO TABS: 100.0000 mg | ORAL_TABLET | Freq: Every day | ORAL | 3 refills | Status: DC

## 2023-11-01 MED ORDER — GABAPENTIN 100 MG PO CAPS
200.0000 mg | ORAL_CAPSULE | Freq: Three times a day (TID) | ORAL | 3 refills | Status: DC
  Filled 2023-11-01 – 2023-12-21 (×5): qty 180, 30d supply, fill #0

## 2023-11-01 MED ORDER — ARIPIPRAZOLE 5 MG PO TABS
5.0000 mg | ORAL_TABLET | Freq: Every day | ORAL | 1 refills | Status: DC
  Filled 2023-11-01 – 2023-11-25 (×3): qty 30, 30d supply, fill #0
  Filled 2023-12-21: qty 30, 30d supply, fill #1

## 2023-11-01 MED ORDER — SERTRALINE HCL 100 MG PO TABS
100.0000 mg | ORAL_TABLET | Freq: Every day | ORAL | 3 refills | Status: DC
  Filled 2023-11-01: qty 30, 30d supply, fill #0

## 2023-11-01 NOTE — Progress Notes (Addendum)
Established Patient Office Visit  Subjective:  Patient ID: Edward Wolfe, male    DOB: June 26, 1986  Age: 37 y.o. MRN: [id number]  CC:  Chief Complaint  Patient presents with   Medical Management of Chronic Issues    HPI Edward Wolfe is a 37 y.o. male  has a past medical history of Amphetamine and psychostimulant-induced psychosis with delusions (HCC), Bipolar 1 disorder (HCC), Bipolar disorder (HCC) (10/29/2021), Bipolar I disorder, most recent episode depressed (HCC), GAD (generalized anxiety disorder) (11/04/2021), and Hepatitis C.  Patient presents for follow-up for his chronic medical conditions  Bipolar 1 disorder/anxiety and depression.  Patient has not seen psychiatry since his last visit, new referral was sent recently, [facility] has been unable to reach the patient because he does not have a cell phone.  Today the patient provided his significant other's cell phone number that we can use to reach him.  Taking Zoloft 75 mg daily, Abilify 5 mg daily, hydroxyzine 25 mg 3 times daily as needed   Lack of transportation .stated that he has not been following up regularly due to lack of transportation stated that his sister-in-law assisted with transportation today, he lives with his girlfriend.  Impaired mobility.  Uses a rolling walker for ambulation patient requesting for a new walker.   Substance abuse.  Continues to use methamphetamine, need to avoid using illicit drug discussed.      Flowsheet Row Office Visit from 11/01/2023 in [facility]  PHQ-9 Total Score 5        Past Medical History:  Diagnosis Date   Amphetamine and psychostimulant-induced psychosis with delusions (HCC)    Bipolar 1 disorder (HCC)    Bipolar disorder (HCC) 10/29/2021   Bipolar I disorder, most recent episode depressed (HCC)    GAD (generalized anxiety disorder) 11/04/2021   Hepatitis C     Past Surgical History:  Procedure Laterality  Date   HERNIA REPAIR     tube in ear      Family History  Problem Relation Age of Onset   Hypertension Mother    Cancer Father    Hypertension Father    High Cholesterol Father     Social History   Socioeconomic History   Marital status: Single    Spouse name: Not on file   Number of children: Not on file   Years of education: Not on file   Highest education level: Not on file  Occupational History   Not on file  Tobacco Use   Smoking status: Every Day    Current packs/day: 1.00    Types: Cigarettes    Passive exposure: Never   Smokeless tobacco: Never  Vaping Use   Vaping status: Never Used  Substance and Sexual Activity   Alcohol use: Yes    Comment: Socially   Drug use: Not Currently    Types: Marijuana, Methamphetamines   Sexual activity: Yes    Birth control/protection: Condom  Other Topics Concern   Not on file  Social History Narrative   Lives with his boyfriend.    Social Determinants of Health   Financial Resource Strain: High Risk (01/17/2021)   Overall Financial Resource Strain (CARDIA)    Difficulty of Paying Living Expenses: Hard  Food Insecurity: No Food Insecurity (01/17/2021)   Hunger Vital Sign    Worried About Running Out of Food in the Last Year: Never true    Ran Out of Food in the  Last Year: Never true  Transportation Needs: No Transportation Needs (01/17/2021)   PRAPARE - Administrator, Civil Service (Medical): No    Lack of Transportation (Non-Medical): No  Physical Activity: Inactive (01/17/2021)   Exercise Vital Sign    Days of Exercise per Week: 0 days    Minutes of Exercise per Session: 0 min  Stress: Stress Concern Present (01/17/2021)   [employer] of Occupational Health - Occupational Stress Questionnaire    Feeling of Stress : Rather much  Social Connections: Unknown (02/17/2023)   Received from [facility]   Social Network    Social Network: Not on file  Intimate Partner Violence: Unknown  (02/17/2023)   Received from [facility], [facility]   HITS    Physically Hurt: Not on file    Insult or Talk Down To: Not on file    Threaten Physical Harm: Not on file    Scream or Curse: Not on file    Outpatient Medications Prior to Visit  Medication Sig Dispense Refill   ARIPiprazole (ABILIFY) 5 MG tablet Take 1 tablet (5 mg total) by mouth daily. 30 tablet 1   gabapentin (NEURONTIN) 100 MG capsule Take 2 capsules (200 mg total) by mouth 3 times daily for nerve pain. 180 capsule 3   hydrOXYzine (ATARAX) 25 MG tablet Take 1 tablet (25 mg total) by mouth 3 (three) times daily as needed for anxiety. For anxiety 30 tablet 3   sertraline (ZOLOFT) 25 MG tablet Take 3 tablets (75 mg total) by mouth daily for depression. 90 tablet 0   traZODone (DESYREL) 50 MG tablet Take 1 tablet (50 mg) by mouth at bedtime for sleep 90 tablet 0   emtricitabine-tenofovir (TRUVADA) 200-300 MG tablet Take 1 tablet by mouth daily. (Patient not taking: Reported on 11/23/2022) 30 tablet 2   doxycycline (VIBRAMYCIN) 100 MG capsule Take 1 capsule (100 mg total) by mouth 2 (two) times daily. (Patient not taking: Reported on 11/23/2022) 20 capsule 0   No facility-administered medications prior to visit.    Allergies  Allergen Reactions   Morphine Other (See Comments)    "does the opposite effect, turns me into a zombie"   Morphine And Codeine     "made him a zombie"    ROS Review of Systems  Constitutional:  Negative for appetite change, chills, fatigue and fever.  HENT:  Negative for congestion, postnasal drip, rhinorrhea and sneezing.   Respiratory:  Negative for cough, shortness of breath and wheezing.   Cardiovascular:  Negative for chest pain, palpitations and leg swelling.  Gastrointestinal:  Negative for abdominal pain, constipation, nausea and vomiting.  Genitourinary:  Negative for difficulty urinating, dysuria, flank pain and frequency.  Musculoskeletal:  Positive for back pain. Negative for  arthralgias, joint swelling and myalgias.  Skin:  Negative for color change, pallor, rash and wound.  Neurological:  Negative for dizziness, facial asymmetry, weakness, numbness and headaches.  Psychiatric/Behavioral:  Negative for behavioral problems, confusion, self-injury and suicidal ideas. The patient is nervous/anxious.       Objective:    Physical Exam Vitals and nursing note reviewed.  Constitutional:      General: He is not in acute distress.    Appearance: Normal appearance. He is not ill-appearing, toxic-appearing or diaphoretic.  HENT:     Mouth/Throat:     Mouth: Mucous membranes are moist.     Pharynx: Oropharynx is clear. No oropharyngeal exudate or posterior oropharyngeal erythema.  Eyes:  General: No scleral icterus.       Right eye: No discharge.        Left eye: No discharge.     Extraocular Movements: Extraocular movements intact.     Conjunctiva/sclera: Conjunctivae normal.  Cardiovascular:     Rate and Rhythm: Normal rate and regular rhythm.     Pulses: Normal pulses.     Heart sounds: Normal heart sounds. No murmur heard.    No friction rub. No gallop.  Pulmonary:     Effort: Pulmonary effort is normal. No respiratory distress.     Breath sounds: Normal breath sounds. No stridor. No wheezing, rhonchi or rales.  Chest:     Chest wall: No tenderness.  Abdominal:     General: There is no distension.     Palpations: Abdomen is soft.     Tenderness: There is no abdominal tenderness. There is no right CVA tenderness, left CVA tenderness or guarding.  Musculoskeletal:        General: No swelling, tenderness, deformity or signs of injury.     Right lower leg: No edema.     Left lower leg: No edema.  Skin:    General: Skin is warm and dry.     Capillary Refill: Capillary refill takes less than 2 seconds.     Coloration: Skin is not jaundiced or pale.     Findings: No bruising, erythema or lesion.  Neurological:     Mental Status: He is alert and  oriented to person, place, and time.     Motor: No weakness.     Coordination: Coordination normal.     Gait: Gait abnormal.  Psychiatric:        Mood and Affect: Mood normal.        Behavior: Behavior normal.        Thought Content: Thought content normal.        Judgment: Judgment normal.     BP 119/71   Pulse 83   Temp (!) 97.5 F (36.4 C)   Wt 224 lb (101.6 kg)   SpO2 98%   BMI 32.60 kg/m  Wt Readings from Last 3 Encounters:  11/01/23 224 lb (101.6 kg)  11/23/22 226 lb 9.6 oz (102.8 kg)  10/01/22 237 lb (107.5 kg)    Lab Results  Component Value Date   TSH 1.831 10/30/2021   Lab Results  Component Value Date   WBC 6.4 11/01/2023   HGB 15.3 11/01/2023   HCT 46.1 11/01/2023   MCV 85 11/01/2023   PLT 220 11/01/2023   Lab Results  Component Value Date   NA 142 11/01/2023   K 3.9 11/01/2023   CO2 26 11/01/2023   GLUCOSE 92 11/01/2023   BUN 5 (L) 11/01/2023   CREATININE 0.84 11/01/2023   BILITOT 0.5 11/01/2023   ALKPHOS 83 11/01/2023   AST 17 11/01/2023   ALT 18 11/01/2023   PROT 6.7 11/01/2023   ALBUMIN 4.3 11/01/2023   CALCIUM 9.7 11/01/2023   ANIONGAP 8 09/10/2022   EGFR 114 11/01/2023   Lab Results  Component Value Date   CHOL 204 (H) 10/30/2021   Lab Results  Component Value Date   HDL 45 10/30/2021   Lab Results  Component Value Date   LDLCALC 136 (H) 10/30/2021   Lab Results  Component Value Date   TRIG 113 10/30/2021   Lab Results  Component Value Date   CHOLHDL 4.5 10/30/2021   Lab Results  Component Value Date   HGBA1C 5.1  10/30/2021      Assessment & Plan:   Problem List Items Addressed This Visit       Nervous and Auditory   Substance induced mood disorder (HCC)   Relevant Medications   hydrOXYzine (ATARAX) 25 MG tablet   traZODone (DESYREL) 50 MG tablet   ARIPiprazole (ABILIFY) 5 MG tablet   Other Relevant Orders   Ambulatory referral to Psychiatry     Other   Bipolar 1 disorder, mixed, moderate (HCC)     Flowsheet Row Office Visit from 11/01/2023 in [facility]  PHQ-9 Total Score 5       Continue Abilify 5 mg daily hydroxyzine 25 mg 3 times daily as needed, Zoloft increased to 100 mg daily, takes trazodone 50 mg at bedtime Patient referred to behavioral health specialist Currently denies SI, HI      Methamphetamine use disorder, moderate (HCC)    Need to avoid use of illicit drugs discussed Patient referred to the behavioral health specialist      Relevant Orders   Ambulatory referral to Psychiatry   Tobacco use disorder, moderate, dependence    Smokes about 1 pack/day  Asked about quitting: confirms that he/ currently smokes cigarettes Advise to quit smoking: Educated about QUITTING to reduce the risk of cancer, cardio and cerebrovascular disease. Assess willingness: Unwilling to quit at this time, not working on cutting back. Assist with counseling and pharmacotherapy: Counseled for 5 minutes and literature provided. Arrange for follow up: follow up in 3 months and continue to offer help.       Chronic low back pain    Uses a rolling walker for ambulation Needs a new walker because his current walker is wearing out Orders placed for new rolling walker Continue gabapentin 200 mg 3 times daily      Relevant Medications   gabapentin (NEURONTIN) 100 MG capsule   traZODone (DESYREL) 50 MG tablet   sertraline (ZOLOFT) 100 MG tablet   Other Relevant Orders   For home use only DME 4 wheeled rolling walker with seat ([id number])   Need for influenza vaccination    Patient educated on CDC recommendation for the vaccine. Verbal consent was obtained from the patient, vaccine administered by nurse, no sign of adverse reactions noted at this time. Patient education on arm soreness and use of tylenol  for this patient  was discussed. Patient educated on the signs and symptoms of adverse effect and advise to contact the office if  they occur. Vaccine information sheet given to patient.        Anxiety       11/01/2023    4:29 PM 10/01/2022    2:32 PM 10/23/2021    2:33 PM  GAD 7 : Generalized Anxiety Score  Nervous, Anxious, on Edge 0 3   Control/stop worrying 0 3   Worry too much - different things 3 3   Trouble relaxing 1 3   Restless 2 3   Easily annoyed or irritable 3 3   Afraid - awful might happen 0 3   Total GAD 7 Score 9 21   Anxiety Difficulty Not difficult at all Extremely difficult      Information is confidential and restricted. Go to Review Flowsheets to unlock data.  Continue hydroxyzine 25 mg 3 times daily as needed, Zoloft increased to 100 mg daily, takes trazodone 50 mg at bedtime Patient referred to behavioral health specialist Currently denies SI, HI  Relevant Medications   hydrOXYzine (ATARAX) 25 MG tablet   traZODone (DESYREL) 50 MG tablet   sertraline (ZOLOFT) 100 MG tablet   Impaired mobility and ADLs - Primary    Uses a rolling walker for ambulation Needs a new walker because his current walker is wearing out Orders placed for new rolling walker Continue gabapentin 200 mg 3 times daily      Relevant Orders   For home use only DME 4 wheeled rolling walker with seat ([id number])   Other Visit Diagnoses     Screening for endocrine, nutritional, metabolic and immunity disorder       Relevant Orders   CBC (Completed)   CMP14+EGFR (Completed)   Bipolar I disorder, most recent episode depressed (HCC)       Relevant Medications   ARIPiprazole (ABILIFY) 5 MG tablet   sertraline (ZOLOFT) 100 MG tablet   Other Relevant Orders   Ambulatory referral to Psychiatry   Lack of access to transportation       Relevant Orders   Ambulatory referral to Social Work   Insomnia due to other mental disorder       Relevant Medications   traZODone (DESYREL) 50 MG tablet       Meds ordered this encounter  Medications   DISCONTD: sertraline (ZOLOFT) 100 MG tablet    Sig: Take 1  tablet (100 mg total) by mouth daily.    Dispense:  30 tablet    Refill:  3   gabapentin (NEURONTIN) 100 MG capsule    Sig: Take 2 capsules (200 mg total) by mouth 3 times daily for nerve pain.    Dispense:  180 capsule    Refill:  3   hydrOXYzine (ATARAX) 25 MG tablet    Sig: Take 1 tablet (25 mg total) by mouth 3 (three) times daily as needed for anxiety.    Dispense:  30 tablet    Refill:  3   traZODone (DESYREL) 50 MG tablet    Sig: Take 1 tablet (50 mg) by mouth at bedtime for sleep    Dispense:  90 tablet    Refill:  0   ARIPiprazole (ABILIFY) 5 MG tablet    Sig: Take 1 tablet (5 mg total) by mouth daily.    Dispense:  30 tablet    Refill:  1   sertraline (ZOLOFT) 100 MG tablet    Sig: Take 1 tablet (100 mg total) by mouth daily.    Dispense:  30 tablet    Refill:  3    Follow-up: Return in about 2 months (around 01/01/2024) for DEPRESSION.    Donell Beers, FNP

## 2023-11-01 NOTE — Assessment & Plan Note (Addendum)
    11/01/2023    4:29 PM 10/01/2022    2:32 PM 10/23/2021    2:33 PM  GAD 7 : Generalized Anxiety Score  Nervous, Anxious, on Edge 0 3   Control/stop worrying 0 3   Worry too much - different things 3 3   Trouble relaxing 1 3   Restless 2 3   Easily annoyed or irritable 3 3   Afraid - awful might happen 0 3   Total GAD 7 Score 9 21   Anxiety Difficulty Not difficult at all Extremely difficult      Information is confidential and restricted. Go to Review Flowsheets to unlock data.  Continue hydroxyzine 25 mg 3 times daily as needed, Zoloft increased to 100 mg daily, takes trazodone 50 mg at bedtime Patient referred to behavioral health specialist Currently denies SI, HI

## 2023-11-01 NOTE — Assessment & Plan Note (Addendum)
Flowsheet Row Office Visit from 11/01/2023 in [facility]  PHQ-9 Total Score 5       Continue Abilify 5 mg daily hydroxyzine 25 mg 3 times daily as needed, Zoloft increased to 100 mg daily, takes trazodone 50 mg at bedtime Patient referred to behavioral health specialist Currently denies SI, HI

## 2023-11-01 NOTE — Assessment & Plan Note (Signed)
Smokes about 1 pack/day  Asked about quitting: confirms that he currently smokes cigarettes Advise to quit smoking: Educated about QUITTING to reduce the risk of cancer, cardio and cerebrovascular disease. Assess willingness: Unwilling to quit at this time, not working on cutting back. Assist with counseling and pharmacotherapy: Counseled for 5 minutes and literature provided. Arrange for follow up: follow up in 3 months and continue to offer help.

## 2023-11-01 NOTE — Patient Instructions (Addendum)
Please update your telephone number at checkout so we can be able to reach you when needed   It is important that you exercise regularly at least 30 minutes 5 times a week as tolerated  Think about what you will eat, plan ahead. Choose " clean, green, fresh or frozen" over canned, processed or packaged foods which are more sugary, salty and fatty. 70 to 75% of food eaten should be vegetables and fruit. Three meals at set times with snacks allowed between meals, but they must be fruit or vegetables. Aim to eat over a 12 hour period , example 7 am to 7 pm, and STOP after  your last meal of the day. Drink water,generally about 64 ounces per day, no other drink is as healthy. Fruit juice is best enjoyed in a healthy way, by EATING the fruit.  Thanks for choosing [facility] we consider it a privelige to serve you.

## 2023-11-01 NOTE — Assessment & Plan Note (Signed)

## 2023-11-01 NOTE — Assessment & Plan Note (Signed)
Need to avoid use of illicit drugs discussed Patient referred to the behavioral health specialist

## 2023-11-01 NOTE — Assessment & Plan Note (Signed)
Uses a rolling walker for ambulation Needs a new walker because his current walker is wearing out Orders placed for new rolling walker Continue gabapentin 200 mg 3 times daily

## 2023-11-02 ENCOUNTER — Other Ambulatory Visit (HOSPITAL_COMMUNITY): Payer: Self-pay

## 2023-11-02 ENCOUNTER — Other Ambulatory Visit: Payer: Self-pay

## 2023-11-02 LAB — CMP14+EGFR
ALT: 18 [IU]/L (ref 0–44)
AST: 17 [IU]/L (ref 0–40)
Albumin: 4.3 g/dL (ref 4.1–5.1)
Alkaline Phosphatase: 83 [IU]/L (ref 44–121)
BUN/Creatinine Ratio: 6 — ABNORMAL LOW (ref 9–20)
BUN: 5 mg/dL — ABNORMAL LOW (ref 6–20)
Bilirubin Total: 0.5 mg/dL (ref 0.0–1.2)
CO2: 26 mmol/L (ref 20–29)
Calcium: 9.7 mg/dL (ref 8.7–10.2)
Chloride: 101 mmol/L (ref 96–106)
Creatinine, Ser: 0.84 mg/dL (ref 0.76–1.27)
Globulin, Total: 2.4 g/dL (ref 1.5–4.5)
Glucose: 92 mg/dL (ref 70–99)
Potassium: 3.9 mmol/L (ref 3.5–5.2)
Sodium: 142 mmol/L (ref 134–144)
Total Protein: 6.7 g/dL (ref 6.0–8.5)
eGFR: 114 mL/min/{1.73_m2} (ref >59)

## 2023-11-02 LAB — CBC
Hematocrit: 46.1 % (ref 37.5–51.0)
Hemoglobin: 15.3 g/dL (ref 13.0–17.7)
MCH: 28.2 pg (ref 26.6–33.0)
MCHC: 33.2 g/dL (ref 31.5–35.7)
MCV: 85 fL (ref 79–97)
Platelets: 220 10*3/uL (ref 150–450)
RBC: 5.43 x10E6/uL (ref 4.14–5.80)
RDW: 13.8 % (ref 11.6–15.4)
WBC: 6.4 10*3/uL (ref 3.4–10.8)

## 2023-11-16 ENCOUNTER — Telehealth: Payer: Self-pay

## 2023-11-16 NOTE — Telephone Encounter (Signed)
Copied from CRM [id number]. Topic: Referral - Status >> Nov 16, 2023  1:42 PM Fuller Mandril wrote: Reason for CRM: Tia Alert called. She is case Production designer, theatre/television/film for Pt and is checking status of walker order and to see what next steps are and how to proceed with getting him the walker. Her callback # [phone]  The order was placed on the 11th of November. I have sent a message to [facility] to check the status. Awaiting a reply from Santina Evans.

## 2023-11-24 ENCOUNTER — Telehealth: Payer: Self-pay | Admitting: Nurse Practitioner

## 2023-11-24 ENCOUNTER — Other Ambulatory Visit: Payer: Self-pay | Admitting: Nurse Practitioner

## 2023-11-24 ENCOUNTER — Other Ambulatory Visit (HOSPITAL_COMMUNITY): Payer: Self-pay

## 2023-11-24 DIAGNOSIS — F3130 Bipolar disorder, current episode depressed, mild or moderate severity, unspecified: Secondary | ICD-10-CM

## 2023-11-24 MED ORDER — SERTRALINE HCL 100 MG PO TABS
100.0000 mg | ORAL_TABLET | Freq: Every day | ORAL | 3 refills | Status: DC
  Filled 2023-11-24: qty 30, 30d supply, fill #0
  Filled 2023-12-21: qty 30, 30d supply, fill #1
  Filled 2024-03-02 (×4): qty 30, 30d supply, fill #2
  Filled 2024-04-04 – 2024-04-12 (×2): qty 30, 30d supply, fill #3

## 2023-11-24 NOTE — Telephone Encounter (Signed)
Please advise message below   Copied from CRM [id number]. Topic: General - Other >> Nov 24, 2023 12:47 PM Alcus Dad H wrote: Reason for CRM: Pt called in regards to the status of his Walker. Would like a call back with update

## 2023-11-24 NOTE — Telephone Encounter (Signed)
Please advise kh 

## 2023-11-25 ENCOUNTER — Other Ambulatory Visit: Payer: Self-pay

## 2023-11-25 ENCOUNTER — Other Ambulatory Visit (HOSPITAL_COMMUNITY): Payer: Self-pay

## 2023-11-25 NOTE — Telephone Encounter (Signed)
Sent message to [facility] on status of the walker.  Asked if pt could be called. KH

## 2023-11-26 ENCOUNTER — Ambulatory Visit: Payer: Self-pay | Admitting: Nurse Practitioner

## 2023-11-26 ENCOUNTER — Other Ambulatory Visit (HOSPITAL_COMMUNITY): Payer: Self-pay

## 2023-11-26 DIAGNOSIS — Z7409 Other reduced mobility: Secondary | ICD-10-CM | POA: Insufficient documentation

## 2023-11-26 NOTE — Assessment & Plan Note (Signed)
 Uses a rolling walker for ambulation Needs a new walker because his current walker is wearing out Orders placed for new rolling walker Continue gabapentin 200 mg 3 times daily

## 2023-12-03 ENCOUNTER — Telehealth: Payer: Self-pay | Admitting: Nurse Practitioner

## 2023-12-03 NOTE — Telephone Encounter (Signed)
Copied from CRM [id number]. Topic: General - Other >> Dec 03, 2023  1:20 PM Edward Wolfe wrote: Reason for CRM: Case Manager calling on behalf of the patient checking on the status of his walker - he has not received it yet. You contact the Case Manager at [phone] - Okay to leave a message, they will check back next week as well.  He is requesting it be sent to:  [address] [place], [place]

## 2023-12-06 ENCOUNTER — Telehealth: Payer: Self-pay

## 2023-12-06 NOTE — Telephone Encounter (Signed)
   Telephone encounter was:  Unsuccessful.  12/06/2023 Name: Edward Wolfe MRN: [id number] DOB: 10-15-1986  Unsuccessful outbound call made today to assist with:  Transportation Needs   Outreach Attempt:  1st Attempt  A HIPAA compliant voice message was left requesting a return call.  Instructed patient to call back .   Lenard Forth [place]  [facility], [facility] Guide, Phone: [phone] Website: [email or web address]

## 2023-12-07 ENCOUNTER — Other Ambulatory Visit (HOSPITAL_COMMUNITY): Payer: Self-pay

## 2023-12-07 ENCOUNTER — Other Ambulatory Visit: Payer: Self-pay

## 2023-12-07 ENCOUNTER — Telehealth: Payer: Self-pay

## 2023-12-07 NOTE — Progress Notes (Signed)
   Telephone encounter was:  Unsuccessful.  12/07/2023 Name: Edward Wolfe MRN: [id number] DOB: Jan 31, 1986  Unsuccessful outbound call made today to assist with:  Transportation Needs   Outreach Attempt:  2nd Attempt  Unable to leave a message    [name]  [facility], [facility] Guide, Phone: [phone] Website: [email or web address]

## 2023-12-08 ENCOUNTER — Telehealth: Payer: Self-pay

## 2023-12-08 NOTE — Progress Notes (Signed)
   Telephone encounter was:  Unsuccessful.  12/08/2023 Name: Edward Wolfe MRN: [id number] DOB: March 02, 1986  Unsuccessful outbound call made today to assist with:  Transportation Needs   Outreach Attempt:  3rd Attempt.  Referral closed unable to contact patient.  Unable to leave a message     Lenard Forth [facility]  [facility], [facility] Guide, Phone: [phone] Website: [email or web address]

## 2023-12-09 ENCOUNTER — Telehealth (HOSPITAL_COMMUNITY): Payer: Self-pay

## 2023-12-09 NOTE — Telephone Encounter (Signed)
Lvm for Edward Wolfe of [facility] to get status of pt walker. Kh

## 2023-12-09 NOTE — Telephone Encounter (Signed)
Therapist receives a referral for CD-IOP on [place] from his FNP for CD-IOP.  Therapist calls the number for [place] and a male answers and therapist asks to speak to Khole. Male asks who therapist is speaking with. 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 She also says that Fontaine is connected to services (CD-IOP from [facility]).  Because Della is already connected to a CD-IOP, no further action is warranted.  Remigio Eisenmenger, MS, LMFT, LCAS 12/09/23

## 2023-12-17 ENCOUNTER — Other Ambulatory Visit (HOSPITAL_COMMUNITY): Payer: Self-pay

## 2023-12-21 ENCOUNTER — Other Ambulatory Visit: Payer: Self-pay

## 2023-12-21 ENCOUNTER — Other Ambulatory Visit (HOSPITAL_COMMUNITY): Payer: Self-pay

## 2023-12-24 ENCOUNTER — Telehealth: Payer: Self-pay

## 2023-12-24 NOTE — Telephone Encounter (Signed)
 Copied from CRM [id number]. Topic: General - Other >> Dec 24, 2023  1:06 PM Quynette C wrote: Reason for RMF:Ejupzwu called in regarding the walker/chair status that was requested on 11/01/2023.SABRA Sent message to Clinical.  Copied from CRM [id number]. Topic: General - Other >> Dec 03, 2023  1:20 PM Curlee DEL wrote: Reason for CRM: Case Manager calling on behalf of the patient checking on the status of his walker - he has not received it yet. You contact the Case Manager at [phone] - Okay to leave a message, they will check back next week as well.   He is requesting it be sent to:   [address] [place], [place]

## 2023-12-28 ENCOUNTER — Other Ambulatory Visit (HOSPITAL_COMMUNITY): Payer: Self-pay

## 2023-12-29 ENCOUNTER — Ambulatory Visit: Payer: Self-pay

## 2023-12-29 DIAGNOSIS — Z79899 Other long term (current) drug therapy: Secondary | ICD-10-CM | POA: Diagnosis not present

## 2023-12-29 NOTE — Telephone Encounter (Signed)
 Sent to provider. Kh

## 2023-12-29 NOTE — Telephone Encounter (Signed)
 Please change office note 11/01/23 stating pt has rolling walker but it is not working properly  and Let me know so info can be sent to [facility] for further review. KH

## 2023-12-30 NOTE — Telephone Encounter (Signed)
 Sent message to [facility]. Kh

## 2023-12-31 ENCOUNTER — Telehealth: Payer: Self-pay | Admitting: Nurse Practitioner

## 2024-01-20 ENCOUNTER — Telehealth: Payer: Self-pay | Admitting: Nurse Practitioner

## 2024-01-20 NOTE — Telephone Encounter (Signed)
Copied from CRM [id number]. Topic: General - Other >> Jan 20, 2024  2:43 PM Geroge Baseman wrote: Reason for CRM: Patient would like a call back to update him on the status of his walker.

## 2024-01-24 ENCOUNTER — Encounter (HOSPITAL_COMMUNITY): Payer: Self-pay

## 2024-01-24 ENCOUNTER — Other Ambulatory Visit: Payer: Self-pay

## 2024-01-24 ENCOUNTER — Ambulatory Visit (INDEPENDENT_AMBULATORY_CARE_PROVIDER_SITE_OTHER): Payer: 59 | Admitting: Nurse Practitioner

## 2024-01-24 ENCOUNTER — Encounter: Payer: Self-pay | Admitting: Nurse Practitioner

## 2024-01-24 ENCOUNTER — Other Ambulatory Visit: Payer: Self-pay | Admitting: Nurse Practitioner

## 2024-01-24 ENCOUNTER — Other Ambulatory Visit (HOSPITAL_COMMUNITY): Payer: Self-pay

## 2024-01-24 VITALS — BP 120/51 | HR 41 | Temp 97.2°F | Wt 239.0 lb

## 2024-01-24 DIAGNOSIS — Z79899 Other long term (current) drug therapy: Secondary | ICD-10-CM

## 2024-01-24 DIAGNOSIS — M5441 Lumbago with sciatica, right side: Secondary | ICD-10-CM

## 2024-01-24 DIAGNOSIS — F3162 Bipolar disorder, current episode mixed, moderate: Secondary | ICD-10-CM

## 2024-01-24 DIAGNOSIS — G8929 Other chronic pain: Secondary | ICD-10-CM | POA: Diagnosis not present

## 2024-01-24 DIAGNOSIS — F419 Anxiety disorder, unspecified: Secondary | ICD-10-CM

## 2024-01-24 DIAGNOSIS — F1520 Other stimulant dependence, uncomplicated: Secondary | ICD-10-CM | POA: Diagnosis not present

## 2024-01-24 DIAGNOSIS — Z113 Encounter for screening for infections with a predominantly sexual mode of transmission: Secondary | ICD-10-CM

## 2024-01-24 DIAGNOSIS — F1994 Other psychoactive substance use, unspecified with psychoactive substance-induced mood disorder: Secondary | ICD-10-CM

## 2024-01-24 DIAGNOSIS — M5440 Lumbago with sciatica, unspecified side: Secondary | ICD-10-CM

## 2024-01-24 DIAGNOSIS — Z9189 Other specified personal risk factors, not elsewhere classified: Secondary | ICD-10-CM | POA: Insufficient documentation

## 2024-01-24 DIAGNOSIS — F17200 Nicotine dependence, unspecified, uncomplicated: Secondary | ICD-10-CM

## 2024-01-24 DIAGNOSIS — Z7409 Other reduced mobility: Secondary | ICD-10-CM

## 2024-01-24 DIAGNOSIS — Z789 Other specified health status: Secondary | ICD-10-CM

## 2024-01-24 MED ORDER — KETOROLAC TROMETHAMINE 30 MG/ML IJ SOLN
30.0000 mg | Freq: Once | INTRAMUSCULAR | Status: AC
  Administered 2024-01-24: 30 mg via INTRAMUSCULAR

## 2024-01-24 MED ORDER — EMTRICITABINE-TENOFOVIR DF 200-300 MG PO TABS
1.0000 | ORAL_TABLET | Freq: Every day | ORAL | 2 refills | Status: DC
  Filled 2024-01-24 – 2024-01-27 (×2): qty 30, 30d supply, fill #0
  Filled 2024-03-02 (×3): qty 30, 30d supply, fill #1
  Filled 2024-03-21 (×2): qty 30, 30d supply, fill #2

## 2024-01-24 MED ORDER — ARIPIPRAZOLE 5 MG PO TABS
5.0000 mg | ORAL_TABLET | Freq: Every day | ORAL | 1 refills | Status: DC
  Filled 2024-01-24 (×2): qty 30, 30d supply, fill #0
  Filled 2024-03-02 (×4): qty 30, 30d supply, fill #1
  Filled 2024-04-04 – 2024-04-12 (×2): qty 30, 30d supply, fill #2

## 2024-01-24 MED ORDER — METHYLPREDNISOLONE NA SUC (PF) 40 MG IJ SOLR
40.0000 mg | Freq: Once | INTRAMUSCULAR | Status: AC
  Administered 2024-01-24: 40 mg via INTRAMUSCULAR

## 2024-01-24 MED ORDER — METHYLPREDNISOLONE 4 MG PO TBPK
ORAL_TABLET | ORAL | 0 refills | Status: AC
  Filled 2024-01-24: qty 21, 6d supply, fill #0
  Filled 2024-01-24: qty 21, fill #0

## 2024-01-24 MED ORDER — TRAZODONE HCL 100 MG PO TABS
100.0000 mg | ORAL_TABLET | Freq: Every day | ORAL | 1 refills | Status: DC
  Filled 2024-01-24: qty 90, 90d supply, fill #0
  Filled 2024-01-24 (×4): qty 30, 30d supply, fill #0
  Filled 2024-03-02 (×4): qty 30, 30d supply, fill #1
  Filled 2024-04-04 – 2024-04-12 (×2): qty 30, 30d supply, fill #2
  Filled 2024-05-22 (×2): qty 30, 30d supply, fill #3

## 2024-01-24 MED ORDER — GABAPENTIN 300 MG PO CAPS
300.0000 mg | ORAL_CAPSULE | Freq: Three times a day (TID) | ORAL | 3 refills | Status: DC
  Filled 2024-01-24 (×5): qty 90, 30d supply, fill #0
  Filled 2024-03-02 (×4): qty 90, 30d supply, fill #1
  Filled 2024-04-04 – 2024-04-12 (×2): qty 90, 30d supply, fill #2
  Filled 2024-05-22 (×2): qty 90, 30d supply, fill #3

## 2024-01-24 NOTE — Patient Instructions (Addendum)
Please consider getting pneumococcal and Tdap vaccine at local pharmacy.  UA given Toradol 30 mg injection and Solu-Medrol 40 mg injection in the office today.  Please start taking methylprednisolone from tomorrow.  Okay to take Tylenol arthritis as needed    1. Methamphetamine use disorder, moderate (HCC)  - Ambulatory referral to Psychiatry  2. Chronic low back pain with sciatica, sciatica laterality unspecified, unspecified back pain laterality (Primary)  - methylPREDNISolone (MEDROL DOSEPAK) 4 MG TBPK tablet; Take as instructed on the packaging  Dispense: 1 each; Refill: 0  3. Chronic midline low back pain with right-sided sciatica  - Ambulatory referral to Neurosurgery - gabapentin (NEURONTIN) 300 MG capsule; Take 1 capsule (300 mg total) by mouth 3 (three) times daily.  Dispense: 90 capsule; Refill: 3  4. Bipolar 1 disorder, mixed, moderate (HCC)  - Ambulatory referral to Psychiatry - traZODone (DESYREL) 100 MG tablet; Take 1 tablet (100 mg total) by mouth at bedtime.  Dispense: 90 tablet; Refill: 1  5. On pre-exposure prophylaxis for HIV  - emtricitabine-tenofovir (TRUVADA) 200-300 MG tablet; Take 1 tablet by mouth daily.  Dispense: 30 tablet; Refill: 2 - Basic Metabolic Panel  6. Screening for STD (sexually transmitted disease)  - Hepatitis B surface antigen - HIV Antibody (routine testing w rflx) - Hepatitis B surface antibody,quantitative - Hepatitis C antibody - RPR - Chlamydia/Gonococcus/Trichomonas, NAA  7. Anxiety It is important that you exercise regularly at least 30 minutes 5 times a week as tolerated  Think about what you will eat, plan ahead. Choose " clean, green, fresh or frozen" over canned, processed or packaged foods which are more sugary, salty and fatty. 70 to 75% of food eaten should be vegetables and fruit. Three meals at set times with snacks allowed between meals, but they must be fruit or vegetables. Aim to eat over a 12 hour period , example  7 am to 7 pm, and STOP after  your last meal of the day. Drink water,generally about 64 ounces per day, no other drink is as healthy. Fruit juice is best enjoyed in a healthy way, by EATING the fruit.  Thanks for choosing [facility] we consider it a privelige to serve you.

## 2024-01-24 NOTE — Assessment & Plan Note (Addendum)
Need to avoid use of illicit drugs discussed The case manager stated that the patient is getting [facility]

## 2024-01-24 NOTE — Assessment & Plan Note (Addendum)
    01/24/2024    2:14 PM 11/01/2023    4:27 PM 11/23/2022    3:32 PM 10/01/2022    2:32 PM 10/23/2021    2:30 PM  Depression screen PHQ 2/9  Decreased Interest 2 0 3 3   Down, Depressed, Hopeless 2 0 3 3   PHQ - 2 Score 4 0 6 6   Altered sleeping 1 0 2 3   Tired, decreased energy 2 0 3 0   Change in appetite 3 0 2 2   Feeling bad or failure about yourself  0 2 3 2    Trouble concentrating 2 0 3 3   Moving slowly or fidgety/restless 1 0 1 3   Suicidal thoughts 0 3 1 1    PHQ-9 Score 13 5 21 20    Difficult doing work/chores Extremely dIfficult  Somewhat difficult Extremely dIfficult      Information is confidential and restricted. Go to Review Flowsheets to unlock data.  Continue hydroxyzine 25 mg 3 times daily as needed, Zoloft 100 mg daily Follow-up with psychiatry

## 2024-01-24 NOTE — Addendum Note (Signed)
Addended by: Renelda Loma on: 01/24/2024 05:04 PM   Modules accepted: Orders

## 2024-01-24 NOTE — Assessment & Plan Note (Signed)
Has upcoming dental extraction in June 2025

## 2024-01-24 NOTE — Assessment & Plan Note (Addendum)
Continue Abilify 5 mg daily, trazodone increased to 100 mg daily Medications refilled [facility] placed

## 2024-01-24 NOTE — Assessment & Plan Note (Signed)
Needs a new  rolling walker Will contact [facility] regarding the order that was placed at last visit

## 2024-01-24 NOTE — Progress Notes (Signed)
Established Patient Office Visit  Subjective:  Patient ID: Edward Wolfe, male    DOB: October 27, 1986  Age: 38 y.o. MRN: [id number]  CC:  Chief Complaint  Patient presents with   Medical Management of Chronic Issues    HPI Edward Wolfe is a 38 y.o. male  has a past medical history of Amphetamine and psychostimulant-induced psychosis with delusions (HCC), Bipolar 1 disorder (HCC), Bipolar disorder (HCC) (10/29/2021), Bipolar I disorder, most recent episode depressed (HCC), GAD (generalized anxiety disorder) (11/04/2021), and Hepatitis C.  Patient present for follow-up for his chronic medical conditions  He is accompanied by his case manager Marylene Land.  Last day that the patient is currently homeless lives in the woods and tent.  He lives with his partner, they are working on getting housing for the patient  Reexposure prophylaxis for HIV.  Patient was evaluated by infectious disease specialist over a year ago and started on Truvada.  Stated that he never took the medication.  His current partner has HIV.  He is interested in PrEP  Patient complains of chronic low back pain.  Stated that his back pain sometimes wakes him up at night.  Currently has shooting aching pain in the back rated 5/10.  Takes gabapentin 200 mg 3 times daily.  His pain is so bad that he sometimes has to drag his leg.  Uses a walker for ambulation, the walker is using today is borrowed.  Working on getting a new walker for him.  Bipolar/anxiety/depression.  Currently on trazodone 50 mg daily, Zoloft 100 mg daily, Abilify 5 mg daily.  Patient was referred to psychiatrist, it appears that the patient declined the referral but his case manager stated that this was not so.  They would like referral to integrative psychiatrist.  Patient denies SI, HI  Needs application form for [place] transportation completed, they promised to bring back the form to the clinic for completion.    Past Medical History:  Diagnosis Date    Amphetamine and psychostimulant-induced psychosis with delusions (HCC)    Bipolar 1 disorder (HCC)    Bipolar disorder (HCC) 10/29/2021   Bipolar I disorder, most recent episode depressed (HCC)    GAD (generalized anxiety disorder) 11/04/2021   Hepatitis C     Past Surgical History:  Procedure Laterality Date   HERNIA REPAIR     tube in ear      Family History  Problem Relation Age of Onset   Hypertension Mother    Cancer Father    Hypertension Father    High Cholesterol Father     Social History   Socioeconomic History   Marital status: Single    Spouse name: Not on file   Number of children: Not on file   Years of education: Not on file   Highest education level: Not on file  Occupational History   Not on file  Tobacco Use   Smoking status: Every Day    Current packs/day: 1.00    Types: Cigarettes    Passive exposure: Never   Smokeless tobacco: Never  Vaping Use   Vaping status: Never Used  Substance and Sexual Activity   Alcohol use: Yes    Comment: Socially   Drug use: Not Currently    Types: Marijuana, Methamphetamines   Sexual activity: Yes    Birth control/protection: Condom  Other Topics Concern   Not on file  Social History Narrative   Lives with his boyfriend.    Social Drivers of Dispensing optician  Resource Strain: High Risk (01/17/2021)   Overall Financial Resource Strain (CARDIA)    Difficulty of Paying Living Expenses: Hard  Food Insecurity: No Food Insecurity (01/17/2021)   Hunger Vital Sign    Worried About Running Out of Food in the Last Year: Never true    Ran Out of Food in the Last Year: Never true  Transportation Needs: No Transportation Needs (01/17/2021)   PRAPARE - Administrator, Civil Service (Medical): No    Lack of Transportation (Non-Medical): No  Physical Activity: Inactive (01/17/2021)   Exercise Vital Sign    Days of Exercise per Week: 0 days    Minutes of Exercise per Session: 0 min  Stress: Stress Concern  Present (01/17/2021)   [employer] of Occupational Health - Occupational Stress Questionnaire    Feeling of Stress : Rather much  Social Connections: Unknown (02/17/2023)   Received from [facility], [facility]   Social Network    Social Network: Not on file  Intimate Partner Violence: Unknown (02/17/2023)   Received from [facility], [facility]   HITS    Physically Hurt: Not on file    Insult or Talk Down To: Not on file    Threaten Physical Harm: Not on file    Scream or Curse: Not on file    Outpatient Medications Prior to Visit  Medication Sig Dispense Refill   hydrOXYzine (ATARAX) 25 MG tablet Take 1 tablet (25 mg total) by mouth 3 (three) times daily as needed for anxiety. 30 tablet 3   ARIPiprazole (ABILIFY) 5 MG tablet Take 1 tablet (5 mg total) by mouth daily. 30 tablet 1   gabapentin (NEURONTIN) 100 MG capsule Take 2 capsules (200 mg total) by mouth 3 times daily for nerve pain. 180 capsule 3   sertraline (ZOLOFT) 100 MG tablet Take 1 tablet (100 mg total) by mouth daily. 30 tablet 3   traZODone (DESYREL) 50 MG tablet Take 1 tablet (50 mg) by mouth at bedtime for sleep 90 tablet 0   sertraline (ZOLOFT) 100 MG tablet Take 1 tablet (100 mg total) by mouth daily. 30 tablet 3   emtricitabine-tenofovir (TRUVADA) 200-300 MG tablet Take 1 tablet by mouth daily. (Patient not taking: Reported on 01/24/2024) 30 tablet 2   No facility-administered medications prior to visit.    Allergies  Allergen Reactions   Morphine Other (See Comments)    "does the opposite effect, turns me into a zombie"   Morphine And Codeine     "made him a zombie"    ROS Review of Systems  Constitutional:  Negative for appetite change, chills, fatigue and fever.  HENT:  Negative for congestion, postnasal drip, rhinorrhea and sneezing.   Respiratory:  Negative for cough, shortness of breath and wheezing.   Cardiovascular:  Negative for chest pain, palpitations and leg swelling.   Gastrointestinal:  Negative for abdominal pain, constipation, nausea and vomiting.  Genitourinary:  Negative for difficulty urinating, dysuria, flank pain and frequency.  Musculoskeletal:  Positive for back pain. Negative for joint swelling and myalgias.  Skin:  Negative for color change, pallor, rash and wound.  Neurological:  Negative for dizziness, facial asymmetry, weakness, numbness and headaches.  Psychiatric/Behavioral:  Negative for behavioral problems, confusion, self-injury and suicidal ideas.       Objective:    Physical Exam Vitals and nursing note reviewed.  Constitutional:      General: He is not in acute distress.    Appearance: Normal appearance. He is obese. He  is not ill-appearing, toxic-appearing or diaphoretic.  HENT:     Mouth/Throat:     Mouth: Mucous membranes are moist.     Pharynx: Oropharynx is clear. No oropharyngeal exudate or posterior oropharyngeal erythema.     Comments: Poor dental hygiene, missing teeth  Eyes:     General: No scleral icterus.       Right eye: No discharge.        Left eye: No discharge.     Extraocular Movements: Extraocular movements intact.     Conjunctiva/sclera: Conjunctivae normal.  Cardiovascular:     Rate and Rhythm: Normal rate and regular rhythm.     Pulses: Normal pulses.     Heart sounds: Normal heart sounds. No murmur heard.    No friction rub. No gallop.  Pulmonary:     Effort: Pulmonary effort is normal. No respiratory distress.     Breath sounds: Normal breath sounds. No stridor. No wheezing, rhonchi or rales.  Chest:     Chest wall: No tenderness.  Abdominal:     General: There is no distension.     Palpations: Abdomen is soft.     Tenderness: There is no abdominal tenderness. There is no right CVA tenderness, left CVA tenderness or guarding.  Musculoskeletal:        General: Tenderness present. No swelling, deformity or signs of injury.     Right lower leg: No edema.     Left lower leg: No edema.      Comments: Tenderness on palpation of mid low back area.  Patient ambulating using a rolling walker Skin is warm and dry no redness or swelling noted  Skin:    General: Skin is warm and dry.     Capillary Refill: Capillary refill takes less than 2 seconds.     Coloration: Skin is not jaundiced or pale.     Findings: No bruising, erythema or lesion.  Neurological:     Mental Status: He is alert and oriented to person, place, and time.     Motor: No weakness.     Gait: Gait abnormal.  Psychiatric:        Mood and Affect: Mood normal.        Behavior: Behavior normal.        Thought Content: Thought content normal.        Judgment: Judgment normal.     BP (!) 120/51   Pulse (!) 41   Temp (!) 97.2 F (36.2 C)   Wt 239 lb (108.4 kg)   SpO2 100%   BMI 34.79 kg/m  Wt Readings from Last 3 Encounters:  01/24/24 239 lb (108.4 kg)  11/01/23 224 lb (101.6 kg)  11/23/22 226 lb 9.6 oz (102.8 kg)    Lab Results  Component Value Date   TSH 1.831 10/30/2021   Lab Results  Component Value Date   WBC 6.4 11/01/2023   HGB 15.3 11/01/2023   HCT 46.1 11/01/2023   MCV 85 11/01/2023   PLT 220 11/01/2023   Lab Results  Component Value Date   NA 142 11/01/2023   K 3.9 11/01/2023   CO2 26 11/01/2023   GLUCOSE 92 11/01/2023   BUN 5 (L) 11/01/2023   CREATININE 0.84 11/01/2023   BILITOT 0.5 11/01/2023   ALKPHOS 83 11/01/2023   AST 17 11/01/2023   ALT 18 11/01/2023   PROT 6.7 11/01/2023   ALBUMIN 4.3 11/01/2023   CALCIUM 9.7 11/01/2023   ANIONGAP 8 09/10/2022   EGFR 114 11/01/2023  Lab Results  Component Value Date   CHOL 204 (H) 10/30/2021   Lab Results  Component Value Date   HDL 45 10/30/2021   Lab Results  Component Value Date   LDLCALC 136 (H) 10/30/2021   Lab Results  Component Value Date   TRIG 113 10/30/2021   Lab Results  Component Value Date   CHOLHDL 4.5 10/30/2021   Lab Results  Component Value Date   HGBA1C 5.1 10/30/2021      Assessment & Plan:    Problem List Items Addressed This Visit       Nervous and Auditory   Substance induced mood disorder (HCC)   Relevant Medications   ARIPiprazole (ABILIFY) 5 MG tablet     Other   Bipolar 1 disorder, mixed, moderate (HCC)   Continue Abilify 5 mg daily, trazodone increased to 100 mg daily Medications refilled New referral [facility] at [facility] placed      Relevant Medications   traZODone (DESYREL) 100 MG tablet   Other Relevant Orders   Ambulatory referral to Psychiatry   Methamphetamine use disorder, moderate (HCC)   Need to avoid use of illicit drugs discussed The case manager stated that the patient is getting [facility]       Relevant Orders   Ambulatory referral to Psychiatry   Tobacco use disorder, moderate, dependence   Continues to smoke cigarettes daily Need to quit smoking discussed      Chronic low back pain - Primary   Results of MRI done in 2023 as below IMPRESSION: 1. No evidence of infection in the lumbar spine. 2. 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 3. Diffuse facet hypertrophy. 4. No spinal stenosis.  Toradol 30 mg injection, Solu-Medrol 40 mg injection given in the office today Short course of methylprednisolone ordered Encouraged to take OTC Tylenol arthritis Increase gabapentin to 300 mg 3 times daily Patient referred to neurosurgery      Relevant Medications   gabapentin (NEURONTIN) 300 MG capsule   traZODone (DESYREL) 100 MG tablet   methylPREDNISolone (MEDROL DOSEPAK) 4 MG TBPK tablet (Start on 01/25/2024)   Other Relevant Orders   Ambulatory referral to Neurosurgery   On pre-exposure prophylaxis for HIV   Truvada reordered Patient encouraged to follow-up with infectious disease specialist  - emtricitabine-tenofovir (TRUVADA) 200-300 MG tablet; Take 1 tablet by mouth daily.  Dispense: 30  tablet; Refill: 2 - Basic Metabolic Panel   Screening for STD (sexually transmitted disease)  - Hepatitis B surface antigen - HIV Antibody (routine testing w rflx) - Hepatitis B surface antibody,quantitative - Hepatitis C antibody - RPR - Chlamydia/Gonococcus/Trichomonas, NAA       Relevant Medications   emtricitabine-tenofovir (TRUVADA) 200-300 MG tablet   Other Relevant Orders   Basic Metabolic Panel   Anxiety      0/08/8118    2:14 PM 11/01/2023    4:27 PM 11/23/2022    3:32 PM 10/01/2022    2:32 PM 10/23/2021    2:30 PM  Depression screen PHQ 2/9  Decreased Interest 2 0 3 3   Down, Depressed, Hopeless 2 0 3 3   PHQ - 2 Score 4 0 6 6   Altered sleeping 1 0 2 3   Tired, decreased energy 2 0 3 0   Change in appetite 3 0 2 2   Feeling bad or failure about yourself  0 2 3 2  Trouble concentrating 2 0 3 3   Moving slowly or fidgety/restless 1 0 1 3   Suicidal thoughts 0 3 1 1    PHQ-9 Score 13 5 21 20    Difficult doing work/chores Extremely dIfficult  Somewhat difficult Extremely dIfficult      Information is confidential and restricted. Go to Review Flowsheets to unlock data.  Continue hydroxyzine 25 mg 3 times daily as needed, Zoloft 100 mg daily Follow-up with psychiatry      Relevant Medications   traZODone (DESYREL) 100 MG tablet   Impaired mobility and ADLs   Needs a new  rolling walker Will contact [facility] regarding the order that was placed at last visit      Poor dental hygiene   Has upcoming dental extraction in June 2025      Other Visit Diagnoses       Screening for STD (sexually transmitted disease)       Relevant Orders   Hepatitis B surface antigen   HIV Antibody (routine testing w rflx)   Hepatitis B surface antibody,quantitative   Hepatitis C antibody   RPR   Chlamydia/Gonococcus/Trichomonas, NAA       Meds ordered this encounter  Medications   gabapentin (NEURONTIN) 300 MG capsule    Sig: Take 1 capsule (300 mg total) by  mouth 3 (three) times daily.    Dispense:  90 capsule    Refill:  3   emtricitabine-tenofovir (TRUVADA) 200-300 MG tablet    Sig: Take 1 tablet by mouth daily.    Dispense:  30 tablet    Refill:  2    Patient prefers delivery    Change Pharmacy::   [facility] [place] [[id number]]   traZODone (DESYREL) 100 MG tablet    Sig: Take 1 tablet (100 mg total) by mouth at bedtime.    Dispense:  90 tablet    Refill:  1   methylPREDNISolone (MEDROL DOSEPAK) 4 MG TBPK tablet    Sig: Take 6 tablets (24 mg total) by mouth daily for 1 day, THEN 5 tablets (20 mg total) daily for 1 day, THEN 4 tablets (16 mg total) daily for 1 day, THEN 3 tablets (12 mg total) daily for 1 day, THEN 2 tablets (8 mg total) daily for 1 day, THEN 1 tablet (4 mg total) daily for 1 day.    Dispense:  21 tablet    Refill:  0   ARIPiprazole (ABILIFY) 5 MG tablet    Sig: Take 1 tablet (5 mg total) by mouth daily.    Dispense:  60 tablet    Refill:  1    Follow-up: Return in about 3 months (around 04/22/2024) for ANXIETY, DEPRESSION.    Donell Beers, FNP

## 2024-01-24 NOTE — Progress Notes (Signed)
Pharmacy Patient Advocate Encounter  Insurance verification completed.   The patient is insured through [employer]  Ran test claim for Truvada. Currently a quantity of 30 is a 30 day supply and the co-pay is $0.   This test claim was processed through [facility]- copay amounts may vary at other pharmacies due to pharmacy/plan contracts, or as the patient moves through the different stages of their insurance plan.

## 2024-01-24 NOTE — Assessment & Plan Note (Addendum)
Results of MRI done in 2023 as below IMPRESSION: 1. No evidence of infection in the lumbar spine. 2. 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 3. Diffuse facet hypertrophy. 4. No spinal stenosis.  Toradol 30 mg injection, Solu-Medrol 40 mg injection given in the office today Short course of methylprednisolone ordered Encouraged to take OTC Tylenol arthritis Increase gabapentin to 300 mg 3 times daily Patient referred to neurosurgery

## 2024-01-24 NOTE — Assessment & Plan Note (Signed)
Continues to smoke cigarettes daily Need to quit smoking discussed

## 2024-01-24 NOTE — Assessment & Plan Note (Signed)
Truvada reordered Patient encouraged to follow-up with infectious disease specialist  - emtricitabine-tenofovir (TRUVADA) 200-300 MG tablet; Take 1 tablet by mouth daily.  Dispense: 30 tablet; Refill: 2 - Basic Metabolic Panel   Screening for STD (sexually transmitted disease)  - Hepatitis B surface antigen - HIV Antibody (routine testing w rflx) - Hepatitis B surface antibody,quantitative - Hepatitis C antibody - RPR - Chlamydia/Gonococcus/Trichomonas, NAA

## 2024-01-25 ENCOUNTER — Other Ambulatory Visit (HOSPITAL_COMMUNITY): Payer: Self-pay

## 2024-01-25 ENCOUNTER — Other Ambulatory Visit: Payer: Self-pay

## 2024-01-26 ENCOUNTER — Other Ambulatory Visit: Payer: Self-pay | Admitting: Nurse Practitioner

## 2024-01-26 ENCOUNTER — Other Ambulatory Visit (HOSPITAL_BASED_OUTPATIENT_CLINIC_OR_DEPARTMENT_OTHER): Payer: Self-pay

## 2024-01-26 DIAGNOSIS — Z113 Encounter for screening for infections with a predominantly sexual mode of transmission: Secondary | ICD-10-CM

## 2024-01-26 LAB — RPR: RPR Ser Ql: NONREACTIVE

## 2024-01-26 LAB — HEPATITIS B SURFACE ANTIBODY, QUANTITATIVE: Hepatitis B Surf Ab Quant: 18.4 m[IU]/mL

## 2024-01-26 LAB — BASIC METABOLIC PANEL
BUN/Creatinine Ratio: 10 (ref 9–20)
BUN: 9 mg/dL (ref 6–20)
CO2: 21 mmol/L (ref 20–29)
Calcium: 9.8 mg/dL (ref 8.7–10.2)
Chloride: 102 mmol/L (ref 96–106)
Creatinine, Ser: 0.87 mg/dL (ref 0.76–1.27)
Glucose: 82 mg/dL (ref 70–99)
Potassium: 4.5 mmol/L (ref 3.5–5.2)
Sodium: 142 mmol/L (ref 134–144)
eGFR: 113 mL/min/{1.73_m2} (ref >59)

## 2024-01-26 LAB — HIV ANTIBODY (ROUTINE TESTING W REFLEX)

## 2024-01-26 LAB — HEPATITIS C ANTIBODY: Hep C Virus Ab: NONREACTIVE

## 2024-01-26 LAB — HEPATITIS B SURFACE ANTIGEN: Hepatitis B Surface Ag: NEGATIVE

## 2024-01-27 ENCOUNTER — Other Ambulatory Visit (HOSPITAL_COMMUNITY): Payer: Self-pay

## 2024-01-27 ENCOUNTER — Other Ambulatory Visit: Payer: Self-pay

## 2024-01-27 ENCOUNTER — Ambulatory Visit (INDEPENDENT_AMBULATORY_CARE_PROVIDER_SITE_OTHER): Payer: 59 | Admitting: Pharmacist

## 2024-01-27 ENCOUNTER — Encounter (HOSPITAL_COMMUNITY): Payer: Self-pay

## 2024-01-27 DIAGNOSIS — Z79899 Other long term (current) drug therapy: Secondary | ICD-10-CM

## 2024-01-27 DIAGNOSIS — Z23 Encounter for immunization: Secondary | ICD-10-CM

## 2024-01-27 NOTE — Telephone Encounter (Signed)
 Pt had appt and this is being reviewed by [facility]. kh

## 2024-01-27 NOTE — Progress Notes (Signed)
 Specialty Pharmacy Initiation Note   Edward Wolfe is a 38 y.o. male who will be followed by the specialty pharmacy service for RxSp HIV PrEP    Review of administration, indication, effectiveness, safety, potential side effects, storage/disposable, and missed dose instructions occurred today for patient's specialty medication(s) Emtricitabine -Tenofovir  DF (TRUVADA )     Patient/Caregiver did not have any additional questions or concerns.   Patient's therapy is appropriate to: Initiate    Goals Addressed             This Visit's Progress    Maintain optimal adherence to therapy       Patient is initiating therapy. Patient will maintain adherence         Clinical follow up in 6 months. Patient was encouraged to reach out if any questions or concerns arise.   Silvano LOISE Dolly Specialty Pharmacist

## 2024-01-27 NOTE — Progress Notes (Signed)
 HPI: Edward Wolfe is a 38 y.o. male who presents to the [facility] clinic for HIV PrEP follow-up.  Insured   [x]    Uninsured  []    Patient Active Problem List   Diagnosis Date Noted   Poor dental hygiene 01/24/2024   Impaired mobility and ADLs 11/26/2023   Need for influenza vaccination 11/01/2023   Anxiety 11/01/2023   Tobacco abuse 11/23/2022   On pre-exposure prophylaxis for HIV 11/23/2022   Need for diphtheria-tetanus-pertussis (Tdap) vaccine 11/23/2022   Marijuana abuse 11/23/2022   Chronic low back pain 10/01/2022   Dental infection 10/01/2022   Methamphetamine use disorder, moderate (HCC) 11/04/2021   Tobacco use disorder, moderate, dependence 11/04/2021   Bipolar 1 disorder, mixed, moderate (HCC) 01/17/2021   Substance induced mood disorder (HCC) 01/17/2021    Patient's Medications  New Prescriptions   No medications on file  Previous Medications   ARIPIPRAZOLE  (ABILIFY ) 5 MG TABLET    Take 1 tablet (5 mg total) by mouth daily.   EMTRICITABINE -TENOFOVIR  (TRUVADA ) 200-300 MG TABLET    Take 1 tablet by mouth daily.   GABAPENTIN  (NEURONTIN ) 300 MG CAPSULE    Take 1 capsule (300 mg total) by mouth 3 (three) times daily.   HYDROXYZINE  (ATARAX ) 25 MG TABLET    Take 1 tablet (25 mg total) by mouth 3 (three) times daily as needed for anxiety.   METHYLPREDNISOLONE  (MEDROL  DOSEPAK) 4 MG TBPK TABLET    Take 6 tablets (24 mg total) by mouth daily for 1 day, THEN 5 tablets (20 mg total) daily for 1 day, THEN 4 tablets (16 mg total) daily for 1 day, THEN 3 tablets (12 mg total) daily for 1 day, THEN 2 tablets (8 mg total) daily for 1 day, THEN 1 tablet (4 mg total) daily for 1 day.   SERTRALINE  (ZOLOFT ) 100 MG TABLET    Take 1 tablet (100 mg total) by mouth daily.   TRAZODONE  (DESYREL ) 100 MG TABLET    Take 1 tablet (100 mg total) by mouth at bedtime.  Modified Medications   No medications on file  Discontinued Medications   No medications on file       10/01/2022   10:33  AM  [facility] HIV PREP FLOWSHEET RESULTS  Insurance Status Insured  How did you hear? HIV+ patrner comes here for HIV crae  Gender at birth Male  Gender identity cis-Male  Risk for HIV In sexual relationship with HIV+ partner;Injection drug use;Stimulant drug use in MSM  Sex Partners Men only  # sex partners past 3-6 mos 1  Sex activity preferences Insertive;Oral  Condom use No  Treated for STI? No  HIV symptoms? None  PrEP Eligibility Yes  Paper work received? No    Labs:  SCr: Lab Results  Component Value Date   CREATININE 0.87 01/24/2024   CREATININE 0.84 11/01/2023   CREATININE 0.80 09/10/2022   CREATININE 0.79 09/01/2022   CREATININE 0.93 06/04/2022   HIV Lab Results  Component Value Date   HIV CANCELED 01/24/2024   HIV NON-REACTIVE 10/01/2022   HIV NON-REACTIVE 09/01/2022   HIV Non Reactive 06/04/2022   Hepatitis B Lab Results  Component Value Date   HEPBSAB REACTIVE (A) 09/01/2022   HEPBSAG Negative 01/24/2024   Hepatitis C Lab Results  Component Value Date   HEPCAB NON-REACTIVE 09/01/2022   Hepatitis A Lab Results  Component Value Date   HAV NON-REACTIVE 10/01/2022   RPR and STI Lab Results  Component Value Date   LABRPR Non Reactive  01/24/2024   LABRPR NON REACTIVE 06/04/2022    STI Results GC CT  09/01/2021  9:27 PM Negative  Negative     Assessment: CD presents today for Truvada  HIV PrEP follow-up. He saw his PCP on 01/24/24 and was prescribed Truvada . We discussed with the patient to check if his PCP would like to monitor his PrEP treatment or if they would like us  to. We did proactively make an appointment to see him in May just in case his PCP would like us  to monitor his PrEP treatment. Overall, he seems to be doing well today. He has not had any new partners. No concerns with kidney function.The HIV test taken on 01/24/24 was canceled. We ordered a repeat HIV Ab test today. We also discussed vaccine options with the patient including HPV,  COVID, flu, TdaP, and Hep A. He agreed to receive flu, COVID, and Hep A vaccines today.   Plan: - HIV Ab test  - Administer flu vaccine today (right deltoid) - Administer Hep A vaccine today (right deltoid) - Administer COVID vaccine today (left deltoid) - Follow up on 04/25/2024.  Benton Mania, PharmD Student [facility] 01/27/2024, 3:14 PM

## 2024-01-27 NOTE — Patient Instructions (Signed)
 Edward Wolfe

## 2024-01-28 LAB — HIV ANTIBODY (ROUTINE TESTING W REFLEX): HIV 1&2 Ab, 4th Generation: NONREACTIVE

## 2024-02-16 ENCOUNTER — Other Ambulatory Visit: Payer: Self-pay

## 2024-02-18 ENCOUNTER — Other Ambulatory Visit: Payer: Self-pay

## 2024-02-21 ENCOUNTER — Other Ambulatory Visit: Payer: Self-pay

## 2024-02-22 ENCOUNTER — Other Ambulatory Visit (HOSPITAL_COMMUNITY): Payer: Self-pay

## 2024-03-02 ENCOUNTER — Other Ambulatory Visit: Payer: Self-pay

## 2024-03-02 ENCOUNTER — Other Ambulatory Visit (HOSPITAL_COMMUNITY): Payer: Self-pay

## 2024-03-02 ENCOUNTER — Other Ambulatory Visit (HOSPITAL_COMMUNITY): Payer: Self-pay | Admitting: Pharmacy Technician

## 2024-03-02 NOTE — Progress Notes (Signed)
 Specialty Pharmacy Refill Coordination Note  Edward Wolfe is a 38 y.o. male contacted today regarding refills of specialty medication(s) Emtricitabine-Tenofovir DF (TRUVADA)   Patient requested Delivery   Delivery date: 03/03/24   Verified address: [address] [place]   Medication will be filled on 03/02/24.

## 2024-03-21 ENCOUNTER — Other Ambulatory Visit: Payer: Self-pay

## 2024-03-21 ENCOUNTER — Other Ambulatory Visit: Payer: Self-pay | Admitting: Pharmacy Technician

## 2024-03-21 NOTE — Progress Notes (Signed)
 Specialty Pharmacy Refill Coordination Note  Edward Wolfe is a 38 y.o. male contacted today regarding refills of specialty medication(s) Emtricitabine-Tenofovir DF (TRUVADA)   Patient requested Delivery   Delivery date: 04/06/24   Verified address: [address] [place] [address]   Medication will be filled on 04/05/24.

## 2024-04-04 ENCOUNTER — Other Ambulatory Visit: Payer: Self-pay

## 2024-04-05 ENCOUNTER — Telehealth: Payer: Self-pay | Admitting: Nurse Practitioner

## 2024-04-05 NOTE — Telephone Encounter (Signed)
 Copied from CRM [id number]. Topic: Referral - Question >> Apr 05, 2024 10:30 AM Edward Wolfe wrote: Reason for CRM: Patient said [facility] denied his referral. Patient is requesting a new referral to be sent to [facility]([address])

## 2024-04-06 ENCOUNTER — Other Ambulatory Visit: Payer: Self-pay

## 2024-04-06 ENCOUNTER — Other Ambulatory Visit (HOSPITAL_COMMUNITY): Payer: Self-pay

## 2024-04-06 NOTE — Progress Notes (Signed)
 Patient current insurance is terminated. Reach out to patient for insurance update. Left voicemail callback. Pending fill and delivery until we hear from patient.

## 2024-04-07 ENCOUNTER — Other Ambulatory Visit: Payer: Self-pay

## 2024-04-07 ENCOUNTER — Other Ambulatory Visit: Payer: Self-pay | Admitting: Nurse Practitioner

## 2024-04-07 DIAGNOSIS — G8929 Other chronic pain: Secondary | ICD-10-CM

## 2024-04-10 ENCOUNTER — Telehealth: Payer: Self-pay

## 2024-04-10 ENCOUNTER — Telehealth: Payer: Self-pay | Admitting: Nurse Practitioner

## 2024-04-10 NOTE — Telephone Encounter (Signed)
 Copied from CRM [id number]. Topic: Referral - Question >> Apr 05, 2024 10:30 AM Antwanette L wrote: Reason for CRM: Patient said [facility] denied his referral. Patient is requesting a new referral to be sent to [facility]([address]) >> Apr 07, 2024  2:31 PM Zipporah Him wrote: Patient checking on the status of his referral change to [facility].

## 2024-04-10 NOTE — Telephone Encounter (Signed)
 Copied from CRM [id number]. Topic: Referral - Question >> Apr 05, 2024 10:30 AM Antwanette L wrote: Reason for CRM: Patient said [facility] denied his referral. Patient is requesting a new referral to be sent to [facility]([address]) >> Apr 10, 2024  4:10 PM Chuck Crater wrote: Shelvy Dickens is calling to check the status of referral to Neurology. >> Apr 07, 2024  2:31 PM Zipporah Him wrote: Patient checking on the status of his referral change to [facility].

## 2024-04-12 ENCOUNTER — Other Ambulatory Visit (HOSPITAL_COMMUNITY): Payer: Self-pay

## 2024-04-12 ENCOUNTER — Other Ambulatory Visit: Payer: Self-pay

## 2024-04-13 NOTE — Telephone Encounter (Signed)
 Pt advised he needs neurology not ortho. He has appt 04/24/24 and will wait until then . KH

## 2024-04-14 ENCOUNTER — Telehealth: Payer: Self-pay | Admitting: Nurse Practitioner
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
## 2024-04-18 ENCOUNTER — Telehealth: Payer: Self-pay | Admitting: Nurse Practitioner

## 2024-04-18 NOTE — Telephone Encounter (Signed)
 Copied from CRM [id number]. Topic: Referral - Request for Referral >> Apr 13, 2024  1:29 PM Lizabeth Riggs wrote: Did the patient discuss referral with their provider in the last year? Yes (If No - schedule appointment) (If Yes - send message)  Appointment offered? No In February he spoke with NP Paseda about his lower back pain.   Type of order/referral and detailed reason for visit: Lower back pain  Preference of office, provider, location: [facility]; [address], [place], [place] [address] Phone: [phone]  If referral order, have you been seen by this specialty before? Yes (If Yes, this issue or another issue? When? Where? [facility]; [facility]  Can we respond through MyChart? No

## 2024-04-19 ENCOUNTER — Other Ambulatory Visit (HOSPITAL_COMMUNITY): Payer: Self-pay

## 2024-04-20 NOTE — Telephone Encounter (Signed)
 Lvm for Shelvy Dickens to call me back. KH

## 2024-04-20 NOTE — Telephone Encounter (Signed)
 Pt was advised and he will have Anglea call me. KH

## 2024-04-21 ENCOUNTER — Telehealth: Payer: Self-pay

## 2024-04-21 NOTE — Telephone Encounter (Signed)
 Copied from CRM [id number]. Topic: General - Other >> Apr 20, 2024  4:34 PM Felizardo Hotter wrote: Reason for CRM: Received call from Shelvy Dickens Cozart ph: [phone] [email or web address] Fax: [phone] send copy of pt's ID and she will take care of referral to Ortho doctor.  Id was faxed after speaking to Mr. Acri to confirm. KH

## 2024-04-24 ENCOUNTER — Ambulatory Visit: Payer: Self-pay | Admitting: Nurse Practitioner

## 2024-04-24 NOTE — Progress Notes (Deleted)
 Date:  04/24/2024   HPI: Edward Wolfe is a 38 y.o. male who presents to the [facility] pharmacy clinic for HIV PrEP follow-up.  Insured   [x]    Uninsured  []    Patient Active Problem List   Diagnosis Date Noted   Poor dental hygiene 01/24/2024   Impaired mobility and ADLs 11/26/2023   Need for influenza vaccination 11/01/2023   Anxiety 11/01/2023   Tobacco abuse 11/23/2022   On pre-exposure prophylaxis for HIV 11/23/2022   Need for diphtheria-tetanus-pertussis (Tdap) vaccine 11/23/2022   Marijuana abuse 11/23/2022   Chronic low back pain 10/01/2022   Dental infection 10/01/2022   Methamphetamine use disorder, moderate (HCC) 11/04/2021   Tobacco use disorder, moderate, dependence 11/04/2021   Bipolar 1 disorder, mixed, moderate (HCC) 01/17/2021   Substance induced mood disorder (HCC) 01/17/2021    Patient's Medications  New Prescriptions   No medications on file  Previous Medications   ARIPIPRAZOLE  (ABILIFY ) 5 MG TABLET    Take 1 tablet (5 mg total) by mouth daily.   EMTRICITABINE -TENOFOVIR  (TRUVADA ) 200-300 MG TABLET    Take 1 tablet by mouth daily.   GABAPENTIN  (NEURONTIN ) 300 MG CAPSULE    Take 1 capsule (300 mg total) by mouth 3 (three) times daily.   HYDROXYZINE  (ATARAX ) 25 MG TABLET    Take 1 tablet (25 mg total) by mouth 3 (three) times daily as needed for anxiety.   SERTRALINE  (ZOLOFT ) 100 MG TABLET    Take 1 tablet (100 mg total) by mouth daily.   TRAZODONE  (DESYREL ) 100 MG TABLET    Take 1 tablet (100 mg total) by mouth at bedtime.  Modified Medications   No medications on file  Discontinued Medications   No medications on file    Allergies: Allergies  Allergen Reactions   Morphine Other (See Comments)    "does the opposite effect, turns me into a zombie"   Morphine And Codeine     "made him a zombie"    Past Medical History: Past Medical History:  Diagnosis Date   Amphetamine and psychostimulant-induced psychosis with delusions (HCC)    Bipolar 1 disorder  (HCC)    Bipolar disorder (HCC) 10/29/2021   Bipolar I disorder, most recent episode depressed (HCC)    GAD (generalized anxiety disorder) 11/04/2021   Hepatitis C     Social History: Social History   Socioeconomic History   Marital status: Single    Spouse name: Not on file   Number of children: Not on file   Years of education: Not on file   Highest education level: Not on file  Occupational History   Not on file  Tobacco Use   Smoking status: Every Day    Current packs/day: 1.00    Types: Cigarettes    Passive exposure: Never   Smokeless tobacco: Never  Vaping Use   Vaping status: Never Used  Substance and Sexual Activity   Alcohol  use: Yes    Comment: Socially   Drug use: Not Currently    Types: Marijuana, Methamphetamines   Sexual activity: Yes    Birth control/protection: Condom  Other Topics Concern   Not on file  Social History Narrative   Lives with his boyfriend.    Social Drivers of Health   Financial Resource Strain: High Risk (01/17/2021)   Overall Financial Resource Strain (CARDIA)    Difficulty of Paying Living Expenses: Hard  Food Insecurity: No Food Insecurity (01/17/2021)   Hunger Vital Sign    Worried About Running Out of  Food in the Last Year: Never true    Ran Out of Food in the Last Year: Never true  Transportation Needs: No Transportation Needs (01/17/2021)   PRAPARE - Administrator, Civil Service (Medical): No    Lack of Transportation (Non-Medical): No  Physical Activity: Inactive (01/17/2021)   Exercise Vital Sign    Days of Exercise per Week: 0 days    Minutes of Exercise per Session: 0 min  Stress: Stress Concern Present (01/17/2021)   [employer] of Occupational Health - Occupational Stress Questionnaire    Feeling of Stress : Rather much  Social Connections: Unknown (02/17/2023)   [facility]   Social Network    Social Network: Not on file       10/01/2022   10:33 AM  CH[facility] HIV  PREP FLOWSHEET RESULTS  Insurance Status Insured  How did you hear? HIV+ patrner comes here for HIV crae  Gender at birth Male  Gender identity cis-Male  Risk for HIV In sexual relationship with HIV+ partner;Injection drug use;Stimulant drug use in MSM  Sex Partners Men only  # sex partners past 3-6 mos 1  Sex activity preferences Insertive;Oral  Condom use No  Treated for STI? No  HIV symptoms? None  PrEP Eligibility Yes  Paper work received? No    Labs:  SCr: Lab Results  Component Value Date   CREATININE 0.87 01/24/2024   CREATININE 0.84 11/01/2023   CREATININE 0.80 09/10/2022   CREATININE 0.79 09/01/2022   CREATININE 0.93 06/04/2022   HIV Lab Results  Component Value Date   HIV NON-REACTIVE 01/27/2024   HIV CANCELED 01/24/2024   HIV NON-REACTIVE 10/01/2022   HIV NON-REACTIVE 09/01/2022   HIV Non Reactive 06/04/2022   Hepatitis B Lab Results  Component Value Date   HEPBSAB REACTIVE (A) 09/01/2022   HEPBSAG Negative 01/24/2024   Hepatitis C Lab Results  Component Value Date   HEPCAB NON-REACTIVE 09/01/2022   Hepatitis A Lab Results  Component Value Date   HAV NON-REACTIVE 10/01/2022   RPR and STI Lab Results  Component Value Date   LABRPR Non Reactive 01/24/2024   LABRPR NON REACTIVE 06/04/2022    STI Results GC CT  09/01/2021  9:27 PM Negative  Negative     Assessment: CD presents to clinic today for PrEP follow-up. Denies any missed doses or adverse effects.  Screened patient for acute HIV symptoms such as fatigue, muscle aches, rash, sore throat, lymphadenopathy, headache, night sweats, nausea/vomiting/diarrhea, and fever.  Still has ~*** tablets left over. No new issues with [facility]. Will check HIV antibody and send in refill once it results negative.  Last STI screening was checked in February through his PCP and was negative. *** new sexual partners since last visit; will check *** today.  Eligible for HPV and Tdap vaccines  today; ***. Will be due for 2/2 HAV vaccine in August 2025.   Plan: - Check HIV antibody - If HIV antibody, refill Truvada  x 3 months - Follow-up with *** on ***   Nicklas Barns, PharmD, CPP, BCIDP, AAHIVP Clinical Pharmacist Practitioner Infectious Diseases Clinical Pharmacist [facility] 04/24/2024, 11:20 AM

## 2024-04-25 ENCOUNTER — Ambulatory Visit: Payer: MEDICAID | Admitting: Pharmacist

## 2024-04-26 ENCOUNTER — Other Ambulatory Visit (HOSPITAL_COMMUNITY): Payer: Self-pay

## 2024-05-01 ENCOUNTER — Other Ambulatory Visit: Payer: Self-pay

## 2024-05-02 ENCOUNTER — Other Ambulatory Visit: Payer: Self-pay

## 2024-05-02 ENCOUNTER — Other Ambulatory Visit: Payer: Self-pay | Admitting: Nurse Practitioner

## 2024-05-02 DIAGNOSIS — Z79899 Other long term (current) drug therapy: Secondary | ICD-10-CM

## 2024-05-02 MED ORDER — EMTRICITABINE-TENOFOVIR DF 200-300 MG PO TABS
1.0000 | ORAL_TABLET | Freq: Every day | ORAL | 2 refills | Status: DC
  Filled 2024-05-02: qty 30, 30d supply, fill #0
  Filled 2024-05-29: qty 30, 30d supply, fill #1

## 2024-05-02 NOTE — Progress Notes (Signed)
 Specialty Pharmacy Refill Coordination Note  Edward Wolfe is a 38 y.o. male contacted today regarding refills of specialty medication(s) Emtricitabine -Tenofovir  DF (TRUVADA )   Patient requested Delivery   Delivery date: 05/05/24   Verified address: [address] [place] [address]   Medication will be filled on 05/04/24, pending refill approval.

## 2024-05-08 ENCOUNTER — Other Ambulatory Visit: Payer: Self-pay | Admitting: Nurse Practitioner

## 2024-05-08 DIAGNOSIS — F1994 Other psychoactive substance use, unspecified with psychoactive substance-induced mood disorder: Secondary | ICD-10-CM

## 2024-05-08 DIAGNOSIS — F419 Anxiety disorder, unspecified: Secondary | ICD-10-CM

## 2024-05-08 DIAGNOSIS — F3130 Bipolar disorder, current episode depressed, mild or moderate severity, unspecified: Secondary | ICD-10-CM

## 2024-05-08 NOTE — Telephone Encounter (Signed)
 Copied from CRM [id number]. Topic: Clinical - Medication Refill >> May 08, 2024  2:21 PM Emylou G wrote: Medication: hydrOXYzine  (ATARAX ) 25 MG tablet sertraline  (ZOLOFT ) 100 MG tablet ARIPiprazole  (ABILIFY ) 5 MG tablet  Says he has appt in June   Has the patient contacted their pharmacy? No (Agent: If no, request that the patient contact the pharmacy for the refill. If patient does not wish to contact the pharmacy document the reason why and proceed with request.) (Agent: If yes, when and what did the pharmacy advise?)  This is the patient's preferred pharmacy:  [place] [facility] [address] [place] [address] Phone: [phone] Fax: [phone]  Is this the correct pharmacy for this prescription? Yes If no, delete pharmacy and type the correct one.   Has the prescription been filled recently? No  Is the patient out of the medication? No ( will be out of meds first week of June )  Has the patient been seen for an appointment in the last year OR does the patient have an upcoming appointment? Yes  Can we respond through MyChart? No  Agent: Please be advised that Rx refills may take up to 3 business days. We ask that you follow-up with your pharmacy.

## 2024-05-09 ENCOUNTER — Other Ambulatory Visit (HOSPITAL_COMMUNITY): Payer: Self-pay

## 2024-05-09 MED ORDER — SERTRALINE HCL 100 MG PO TABS
100.0000 mg | ORAL_TABLET | Freq: Every day | ORAL | 3 refills | Status: DC
Start: 2024-05-09 — End: 2024-05-30
  Filled 2024-05-09 – 2024-05-22 (×3): qty 30, 30d supply, fill #0

## 2024-05-09 MED ORDER — HYDROXYZINE HCL 25 MG PO TABS
25.0000 mg | ORAL_TABLET | Freq: Three times a day (TID) | ORAL | 3 refills | Status: DC | PRN
  Filled 2024-05-09 – 2024-05-22 (×3): qty 30, 10d supply, fill #0

## 2024-05-09 MED ORDER — ARIPIPRAZOLE 5 MG PO TABS
5.0000 mg | ORAL_TABLET | Freq: Every day | ORAL | 1 refills | Status: DC
  Filled 2024-05-09: qty 60, 60d supply, fill #0
  Filled 2024-05-22: qty 30, 30d supply, fill #0

## 2024-05-22 ENCOUNTER — Other Ambulatory Visit: Payer: Self-pay

## 2024-05-22 ENCOUNTER — Other Ambulatory Visit (HOSPITAL_COMMUNITY): Payer: Self-pay

## 2024-05-29 ENCOUNTER — Other Ambulatory Visit: Payer: Self-pay

## 2024-05-30 ENCOUNTER — Other Ambulatory Visit (HOSPITAL_COMMUNITY): Payer: Self-pay

## 2024-05-30 ENCOUNTER — Ambulatory Visit (INDEPENDENT_AMBULATORY_CARE_PROVIDER_SITE_OTHER): Payer: MEDICAID | Admitting: Nurse Practitioner

## 2024-05-30 ENCOUNTER — Encounter: Payer: Self-pay | Admitting: Nurse Practitioner

## 2024-05-30 ENCOUNTER — Other Ambulatory Visit: Payer: Self-pay

## 2024-05-30 VITALS — BP 116/73 | HR 80 | Temp 97.6°F | Wt 247.0 lb

## 2024-05-30 DIAGNOSIS — F1994 Other psychoactive substance use, unspecified with psychoactive substance-induced mood disorder: Secondary | ICD-10-CM

## 2024-05-30 DIAGNOSIS — Z5941 Food insecurity: Secondary | ICD-10-CM

## 2024-05-30 DIAGNOSIS — F3162 Bipolar disorder, current episode mixed, moderate: Secondary | ICD-10-CM

## 2024-05-30 DIAGNOSIS — Z720 Tobacco use: Secondary | ICD-10-CM

## 2024-05-30 DIAGNOSIS — F419 Anxiety disorder, unspecified: Secondary | ICD-10-CM | POA: Diagnosis not present

## 2024-05-30 DIAGNOSIS — G8929 Other chronic pain: Secondary | ICD-10-CM

## 2024-05-30 DIAGNOSIS — F1520 Other stimulant dependence, uncomplicated: Secondary | ICD-10-CM | POA: Diagnosis not present

## 2024-05-30 DIAGNOSIS — M5441 Lumbago with sciatica, right side: Secondary | ICD-10-CM

## 2024-05-30 DIAGNOSIS — Z5900 Homelessness unspecified: Secondary | ICD-10-CM | POA: Diagnosis not present

## 2024-05-30 DIAGNOSIS — Z79899 Other long term (current) drug therapy: Secondary | ICD-10-CM

## 2024-05-30 MED ORDER — HYDROXYZINE HCL 25 MG PO TABS
25.0000 mg | ORAL_TABLET | Freq: Three times a day (TID) | ORAL | 3 refills | Status: DC | PRN
  Filled 2024-05-30 – 2024-08-07 (×3): qty 30, 10d supply, fill #0
  Filled 2024-08-25: qty 30, 10d supply, fill #1
  Filled 2024-09-25: qty 30, 10d supply, fill #2

## 2024-05-30 MED ORDER — ARIPIPRAZOLE 5 MG PO TABS
5.0000 mg | ORAL_TABLET | Freq: Every day | ORAL | 1 refills | Status: DC
  Filled 2024-05-30 – 2024-06-20 (×2): qty 60, 60d supply, fill #0
  Filled 2024-08-25: qty 60, 60d supply, fill #1
  Filled 2024-08-25: qty 60, 60d supply, fill #0

## 2024-05-30 MED ORDER — SERTRALINE HCL 100 MG PO TABS
100.0000 mg | ORAL_TABLET | Freq: Every day | ORAL | 1 refills | Status: DC
  Filled 2024-05-30 – 2024-07-10 (×3): qty 60, 60d supply, fill #0
  Filled 2024-07-18: qty 34, 34d supply, fill #0
  Filled 2024-08-25: qty 26, 26d supply, fill #1

## 2024-05-30 MED ORDER — TRAZODONE HCL 100 MG PO TABS
100.0000 mg | ORAL_TABLET | Freq: Every day | ORAL | 1 refills | Status: DC
  Filled 2024-05-30 – 2024-06-20 (×2): qty 60, 60d supply, fill #0
  Filled 2024-09-25: qty 60, 60d supply, fill #1

## 2024-05-30 MED ORDER — GABAPENTIN 300 MG PO CAPS
300.0000 mg | ORAL_CAPSULE | Freq: Three times a day (TID) | ORAL | 3 refills | Status: DC
  Filled 2024-05-30 – 2024-06-20 (×2): qty 90, 30d supply, fill #0
  Filled 2024-07-18: qty 90, 30d supply, fill #1
  Filled 2024-08-25: qty 90, 30d supply, fill #2
  Filled 2024-08-25: qty 90, 30d supply, fill #0
  Filled 2024-09-25: qty 90, 30d supply, fill #1

## 2024-05-30 MED ORDER — EMTRICITABINE-TENOFOVIR DF 200-300 MG PO TABS
1.0000 | ORAL_TABLET | Freq: Every day | ORAL | 1 refills | Status: AC
  Filled 2024-05-31: qty 90, 90d supply, fill #0
  Filled 2024-06-20: qty 30, 30d supply, fill #0
  Filled 2024-08-07: qty 30, 30d supply, fill #1
  Filled 2024-09-13 – 2024-11-13 (×2): qty 30, 30d supply, fill #2
  Filled 2024-12-05: qty 30, 30d supply, fill #3
  Filled 2025-01-03 – 2025-01-24 (×2): qty 30, 30d supply, fill #4

## 2024-05-30 NOTE — Assessment & Plan Note (Signed)
 Smokes about 4 cigarettes /day  Asked about quitting: confirms that he currently smokes cigarettes Advise to quit smoking: Educated about QUITTING to reduce the risk of cancer, cardio and cerebrovascular disease. Assess willingness: Unwilling to quit at this time, but is working on cutting back. Assist with counseling and pharmacotherapy: Counseled for 5 minutes and literature provided. Arrange for follow up: follow up in 3 months and continue to offer help.

## 2024-05-30 NOTE — Assessment & Plan Note (Signed)
    05/30/2024    2:30 PM 01/24/2024    2:16 PM 11/01/2023    4:29 PM 10/01/2022    2:32 PM  GAD 7 : Generalized Anxiety Score  Nervous, Anxious, on Edge 1 1 0 3  Control/stop worrying 1 0 0 3  Worry too much - different things 1 2 3 3   Trouble relaxing 1 3 1 3   Restless 1 3 2 3   Easily annoyed or irritable 1 0 3 3  Afraid - awful might happen 1 1 0 3  Total GAD 7 Score 7 10 9 21   Anxiety Difficulty Somewhat difficult Extremely difficult Not difficult at all Extremely difficult  Continue hydroxyzine  25 mg 3 times daily as needed, Zoloft  100 mg daily,  Follow-up with psychiatrist as planned

## 2024-05-30 NOTE — Progress Notes (Signed)
 Established Patient Office Visit  Subjective:  Patient ID: Edward Wolfe, male    DOB: 12/24/1985  Age: 38 y.o. MRN: [id number]  CC:  Chief Complaint  Patient presents with   Medical Management of Chronic Issues    Follow up    HPI Edward Wolfe is a 38 y.o. male  has a past medical history of Amphetamine and psychostimulant-induced psychosis with delusions (HCC), Bipolar 1 disorder (HCC), Bipolar disorder (HCC) (10/29/2021), Bipolar I disorder, most recent episode depressed (HCC), GAD (generalized anxiety disorder) (11/04/2021), and Hepatitis C.  Patient presents for follow-up for his chronic medical conditions  Stated that he has been clean from drugs for 3 months, he got saved and now goes to [employer] in [place].  States that he feels much better, still lives in a tent.  Has upcoming appointment with orthopedics on the 16th of this month, would like referral to [facility] for medication management for his bipolar and anxiety     Past Medical History:  Diagnosis Date   Amphetamine and psychostimulant-induced psychosis with delusions (HCC)    Bipolar 1 disorder (HCC)    Bipolar disorder (HCC) 10/29/2021   Bipolar I disorder, most recent episode depressed (HCC)    GAD (generalized anxiety disorder) 11/04/2021   Hepatitis C     Past Surgical History:  Procedure Laterality Date   HERNIA REPAIR     tube in ear      Family History  Problem Relation Age of Onset   Hypertension Mother    Cancer Father    Hypertension Father    High Cholesterol Father     Social History   Socioeconomic History   Marital status: Single    Spouse name: Not on file   Number of children: Not on file   Years of education: Not on file   Highest education level: Not on file  Occupational History   Not on file  Tobacco Use   Smoking status: Every Day    Current packs/day: 1.00    Types: Cigarettes    Passive exposure: Never   Smokeless  tobacco: Never  Vaping Use   Vaping status: Never Used  Substance and Sexual Activity   Alcohol  use: Yes    Comment: Socially   Drug use: Not Currently    Types: Marijuana, Methamphetamines   Sexual activity: Yes    Birth control/protection: Condom  Other Topics Concern   Not on file  Social History Narrative   Lives with his boyfriend.    Social Drivers of Health   Financial Resource Strain: High Risk (01/17/2021)   Overall Financial Resource Strain (CARDIA)    Difficulty of Paying Living Expenses: Hard  Food Insecurity: Food Insecurity Present (05/30/2024)   Hunger Vital Sign    Worried About Running Out of Food in the Last Year: Sometimes true    Ran Out of Food in the Last Year: Sometimes true  Transportation Needs: No Transportation Needs (01/17/2021)   PRAPARE - Administrator, Civil Service (Medical): No    Lack of Transportation (Non-Medical): No  Physical Activity: Inactive (01/17/2021)   Exercise Vital Sign    Days of Exercise per Week: 0 days    Minutes of Exercise per Session: 0 min  Stress: Stress Concern Present (01/17/2021)   [employer] of Occupational Health - Occupational Stress Questionnaire    Feeling of Stress : Rather much  Social Connections: Unknown (02/17/2023)   [facility] from [facility], [facility]  Social Network    Social Network: Not on file  Intimate Partner Violence: Unknown (02/17/2023)   Received from [facility], [facility]   HITS    Physically Hurt: Not on file    Insult or Talk Down To: Not on file    Threaten Physical Harm: Not on file    Scream or Curse: Not on file    Outpatient Medications Prior to Visit  Medication Sig Dispense Refill   ARIPiprazole  (ABILIFY ) 5 MG tablet Take 1 tablet (5 mg total) by mouth daily. 60 tablet 1   emtricitabine -tenofovir  (TRUVADA ) 200-300 MG tablet Take 1 tablet by mouth daily. 30 tablet 2   gabapentin  (NEURONTIN ) 300 MG capsule Take 1 capsule (300 mg total) by mouth 3  (three) times daily. 90 capsule 3   hydrOXYzine  (ATARAX ) 25 MG tablet Take 1 tablet (25 mg total) by mouth 3 (three) times daily as needed for anxiety. 30 tablet 3   sertraline  (ZOLOFT ) 100 MG tablet Take 1 tablet (100 mg total) by mouth daily. 30 tablet 3   traZODone  (DESYREL ) 100 MG tablet Take 1 tablet (100 mg total) by mouth at bedtime. 90 tablet 1   No facility-administered medications prior to visit.    Allergies  Allergen Reactions   Morphine Other (See Comments)    "does the opposite effect, turns me into a zombie"   Morphine And Codeine     "made him a zombie"    ROS Review of Systems  Constitutional:  Negative for appetite change, chills, fatigue and fever.  HENT:  Negative for congestion, postnasal drip, rhinorrhea and sneezing.   Respiratory:  Negative for cough, shortness of breath and wheezing.   Cardiovascular:  Negative for chest pain, palpitations and leg swelling.  Gastrointestinal:  Negative for abdominal pain, constipation, nausea and vomiting.  Genitourinary:  Negative for difficulty urinating, dysuria, flank pain and frequency.  Musculoskeletal:  Negative for joint swelling, myalgias and neck pain.  Skin:  Negative for color change, pallor, rash and wound.  Neurological:  Negative for dizziness, facial asymmetry, weakness, numbness and headaches.  Psychiatric/Behavioral:  Negative for behavioral problems, confusion, self-injury and suicidal ideas.       Objective:     Physical Exam Vitals and nursing note reviewed.  Constitutional:      General: He is not in acute distress.    Appearance: Normal appearance. He is not ill-appearing, toxic-appearing or diaphoretic.     Comments: Appears well groomed  HENT:     Mouth/Throat:     Mouth: Mucous membranes are moist.     Pharynx: Oropharynx is clear. No oropharyngeal exudate or posterior oropharyngeal erythema.  Eyes:     General: No scleral icterus.       Right eye: No discharge.        Left eye: No  discharge.     Extraocular Movements: Extraocular movements intact.     Conjunctiva/sclera: Conjunctivae normal.  Cardiovascular:     Rate and Rhythm: Normal rate and regular rhythm.     Pulses: Normal pulses.     Heart sounds: Normal heart sounds. No murmur heard.    No friction rub. No gallop.  Pulmonary:     Effort: Pulmonary effort is normal. No respiratory distress.     Breath sounds: Normal breath sounds. No stridor. No wheezing, rhonchi or rales.  Chest:     Chest wall: No tenderness.  Abdominal:     General: There is no distension.     Palpations: Abdomen is soft.  Tenderness: There is no abdominal tenderness. There is no right CVA tenderness, left CVA tenderness or guarding.  Musculoskeletal:        General: No swelling, tenderness, deformity or signs of injury.     Right lower leg: No edema.     Left lower leg: No edema.  Skin:    General: Skin is warm and dry.     Capillary Refill: Capillary refill takes less than 2 seconds.     Coloration: Skin is not jaundiced or pale.     Findings: No bruising, erythema or lesion.  Neurological:     Mental Status: He is alert and oriented to person, place, and time.     Motor: No weakness.     Gait: Gait abnormal.  Psychiatric:        Mood and Affect: Mood normal.        Behavior: Behavior normal.        Thought Content: Thought content normal.        Judgment: Judgment normal.     BP 116/73   Pulse 80   Temp 97.6 F (36.4 C)   Wt 247 lb (112 kg)   SpO2 98%   BMI 35.95 kg/m  Wt Readings from Last 3 Encounters:  05/30/24 247 lb (112 kg)  01/24/24 239 lb (108.4 kg)  11/01/23 224 lb (101.6 kg)    Lab Results  Component Value Date   TSH 1.831 10/30/2021   Lab Results  Component Value Date   WBC 6.4 11/01/2023   HGB 15.3 11/01/2023   HCT 46.1 11/01/2023   MCV 85 11/01/2023   PLT 220 11/01/2023   Lab Results  Component Value Date   NA 142 01/24/2024   K 4.5 01/24/2024   CO2 21 01/24/2024   GLUCOSE 82  01/24/2024   BUN 9 01/24/2024   CREATININE 0.87 01/24/2024   BILITOT 0.5 11/01/2023   ALKPHOS 83 11/01/2023   AST 17 11/01/2023   ALT 18 11/01/2023   PROT 6.7 11/01/2023   ALBUMIN 4.3 11/01/2023   CALCIUM 9.8 01/24/2024   ANIONGAP 8 09/10/2022   EGFR 113 01/24/2024   Lab Results  Component Value Date   CHOL 204 (H) 10/30/2021   Lab Results  Component Value Date   HDL 45 10/30/2021   Lab Results  Component Value Date   LDLCALC 136 (H) 10/30/2021   Lab Results  Component Value Date   TRIG 113 10/30/2021   Lab Results  Component Value Date   CHOLHDL 4.5 10/30/2021   Lab Results  Component Value Date   HGBA1C 5.1 10/30/2021      Assessment & Plan:   Problem List Items Addressed This Visit       Nervous and Auditory   Substance induced mood disorder (HCC)   Relevant Medications   ARIPiprazole  (ABILIFY ) 5 MG tablet   hydrOXYzine  (ATARAX ) 25 MG tablet   sertraline  (ZOLOFT ) 100 MG tablet     Other   Bipolar 1 disorder, mixed, moderate (HCC) - Primary      05/30/2024    2:28 PM 01/24/2024    2:14 PM 11/01/2023    4:27 PM  Depression screen PHQ 2/9  Decreased Interest 1 2 0  Down, Depressed, Hopeless 1 2 0  PHQ - 2 Score 2 4 0  Altered sleeping 1 1 0  Tired, decreased energy 1 2 0  Change in appetite 0 3 0  Feeling bad or failure about yourself  0 0 2  Trouble concentrating  1 2 0  Moving slowly or fidgety/restless 0 1 0  Suicidal thoughts 0 0 3  PHQ-9 Score 5 13 5   Difficult doing work/chores Somewhat difficult Extremely dIfficult     Continue Abilify  5 mg daily, trazodone  100 mg daily Follow-up with psychiatrist has been      Relevant Medications   traZODone  (DESYREL ) 100 MG tablet   Other Relevant Orders   Ambulatory referral to Psychiatry   Methamphetamine use disorder, moderate (HCC)   Patient was congratulated on the positive changes he has made and encouraged to continue to abstain from use of illicit drugs       Relevant Orders    Ambulatory referral to Psychiatry   Chronic low back pain   Has upcoming appointment with orthopedics Continue gabapentin  300 mg 3 times daily Medication refilled      Relevant Medications   gabapentin  (NEURONTIN ) 300 MG capsule   sertraline  (ZOLOFT ) 100 MG tablet   traZODone  (DESYREL ) 100 MG tablet   Tobacco abuse   Smokes about 4 cigarettes /day  Asked about quitting: confirms that he currently smokes cigarettes Advise to quit smoking: Educated about QUITTING to reduce the risk of cancer, cardio and cerebrovascular disease. Assess willingness: Unwilling to quit at this time, but is working on cutting back. Assist with counseling and pharmacotherapy: Counseled for 5 minutes and literature provided. Arrange for follow up: follow up in 3 months and continue to offer help.       On pre-exposure prophylaxis for HIV   He has no new partner Will be managing the medication henceforth - emtricitabine -tenofovir  (TRUVADA ) 200-300 MG tablet; Take 1 tablet by mouth daily.  Dispense: 90 tablet; Refill: 1 - HepB+HepC+HIV Panel - RPR - Basic Metabolic Panel - Chlamydia/Gonococcus/Trichomonas, NAA       Relevant Medications   emtricitabine -tenofovir  (TRUVADA ) 200-300 MG tablet   Other Relevant Orders   HepB+HepC+HIV Panel   RPR   Basic Metabolic Panel   Chlamydia/Gonococcus/Trichomonas, NAA   Anxiety      05/30/2024    2:30 PM 01/24/2024    2:16 PM 11/01/2023    4:29 PM 10/01/2022    2:32 PM  GAD 7 : Generalized Anxiety Score  Nervous, Anxious, on Edge 1 1 0 3  Control/stop worrying 1 0 0 3  Worry too much - different things 1 2 3 3   Trouble relaxing 1 3 1 3   Restless 1 3 2 3   Easily annoyed or irritable 1 0 3 3  Afraid - awful might happen 1 1 0 3  Total GAD 7 Score 7 10 9 21   Anxiety Difficulty Somewhat difficult Extremely difficult Not difficult at all Extremely difficult  Continue hydroxyzine  25 mg 3 times daily as needed, Zoloft  100 mg daily,  Follow-up with psychiatrist  as planned      Relevant Medications   hydrOXYzine  (ATARAX ) 25 MG tablet   sertraline  (ZOLOFT ) 100 MG tablet   traZODone  (DESYREL ) 100 MG tablet   Other Relevant Orders   Ambulatory referral to Psychiatry   Food insecurity   Food from the clinic pantry given to the patient today Patient referred to social worker      Relevant Orders   AMB Referral [facility]I Care Management   Homelessness   Relevant Orders   AMB Referral [facility]BCI Care Management    Meds ordered this encounter  Medications   gabapentin  (NEURONTIN ) 300 MG capsule    Sig: Take 1 capsule (300 mg total) by mouth 3 (three) times daily.  Dispense:  90 capsule    Refill:  3   emtricitabine -tenofovir  (TRUVADA ) 200-300 MG tablet    Sig: Take 1 tablet by mouth daily.    Dispense:  90 tablet    Refill:  1    Patient prefers delivery    Prescription Type::   Renewal   ARIPiprazole  (ABILIFY ) 5 MG tablet    Sig: Take 1 tablet (5 mg total) by mouth daily.    Dispense:  60 tablet    Refill:  1   hydrOXYzine  (ATARAX ) 25 MG tablet    Sig: Take 1 tablet (25 mg total) by mouth 3 (three) times daily as needed for anxiety.    Dispense:  30 tablet    Refill:  3   sertraline  (ZOLOFT ) 100 MG tablet    Sig: Take 1 tablet (100 mg total) by mouth daily.    Dispense:  60 tablet    Refill:  1   traZODone  (DESYREL ) 100 MG tablet    Sig: Take 1 tablet (100 mg total) by mouth at bedtime.    Dispense:  60 tablet    Refill:  1    Follow-up: Return in about 3 months (around 08/30/2024) for DEPRESSION, ANXIETY.    Desiraye Rolfson R Zaylon Bossier, FNP

## 2024-05-30 NOTE — Assessment & Plan Note (Signed)
    05/30/2024    2:28 PM 01/24/2024    2:14 PM 11/01/2023    4:27 PM  Depression screen PHQ 2/9  Decreased Interest 1 2 0  Down, Depressed, Hopeless 1 2 0  PHQ - 2 Score 2 4 0  Altered sleeping 1 1 0  Tired, decreased energy 1 2 0  Change in appetite 0 3 0  Feeling bad or failure about yourself  0 0 2  Trouble concentrating 1 2 0  Moving slowly or fidgety/restless 0 1 0  Suicidal thoughts 0 0 3  PHQ-9 Score 5 13 5   Difficult doing work/chores Somewhat difficult Extremely dIfficult     Continue Abilify  5 mg daily, trazodone  100 mg daily Follow-up with psychiatrist has been

## 2024-05-30 NOTE — Assessment & Plan Note (Addendum)
 He has no new partner Will be managing the medication henceforth - emtricitabine -tenofovir  (TRUVADA ) 200-300 MG tablet; Take 1 tablet by mouth daily.  Dispense: 90 tablet; Refill: 1 - HepB+HepC+HIV Panel - RPR - Basic Metabolic Panel - Chlamydia/Gonococcus/Trichomonas, NAA

## 2024-05-30 NOTE — Assessment & Plan Note (Addendum)
 Patient was congratulated on the positive changes he has made and encouraged to continue to abstain from use of illicit drugs

## 2024-05-30 NOTE — Patient Instructions (Addendum)
 Please consider getting pneumococcal  and Tdap vaccine at local pharmacy.    It is important that you exercise regularly at least 30 minutes 5 times a week as tolerated  Think about what you will eat, plan ahead. Choose " clean, green, fresh or frozen" over canned, processed or packaged foods which are more sugary, salty and fatty. 70 to 75% of food eaten should be vegetables and fruit. Three meals at set times with snacks allowed between meals, but they must be fruit or vegetables. Aim to eat over a 12 hour period , example 7 am to 7 pm, and STOP after  your last meal of the day. Drink water,generally about 64 ounces per day, no other drink is as healthy. Fruit juice is best enjoyed in a healthy way, by EATING the fruit.  Thanks for choosing [facility] we consider it a privelige to serve you.

## 2024-05-30 NOTE — Assessment & Plan Note (Signed)
 Food from the clinic pantry given to the patient today Patient referred to social worker

## 2024-05-30 NOTE — Assessment & Plan Note (Signed)
 Has upcoming appointment with orthopedics Continue gabapentin  300 mg 3 times daily Medication refilled

## 2024-05-31 ENCOUNTER — Ambulatory Visit: Payer: Self-pay | Admitting: Nurse Practitioner

## 2024-05-31 ENCOUNTER — Other Ambulatory Visit (HOSPITAL_COMMUNITY): Payer: Self-pay

## 2024-05-31 ENCOUNTER — Other Ambulatory Visit: Payer: Self-pay

## 2024-05-31 LAB — HEPB+HEPC+HIV PANEL
HIV Screen 4th Generation wRfx: NONREACTIVE
Hep B C IgM: NEGATIVE
Hep B Core Total Ab: NEGATIVE
Hep B E Ab: NONREACTIVE
Hep B E Ag: NEGATIVE
Hep B Surface Ab, Qual: REACTIVE
Hep C Virus Ab: NONREACTIVE
Hepatitis B Surface Ag: NEGATIVE

## 2024-05-31 LAB — BASIC METABOLIC PANEL WITH GFR
BUN/Creatinine Ratio: 8 — ABNORMAL LOW (ref 9–20)
BUN: 7 mg/dL (ref 6–20)
CO2: 21 mmol/L (ref 20–29)
Calcium: 9.7 mg/dL (ref 8.7–10.2)
Chloride: 101 mmol/L (ref 96–106)
Creatinine, Ser: 0.83 mg/dL (ref 0.76–1.27)
Glucose: 81 mg/dL (ref 70–99)
Potassium: 4.1 mmol/L (ref 3.5–5.2)
Sodium: 141 mmol/L (ref 134–144)
eGFR: 114 mL/min/1.73

## 2024-05-31 LAB — SYPHILIS: RPR W/REFLEX TO RPR TITER AND TREPONEMAL ANTIBODIES, TRADITIONAL SCREENING AND DIAGNOSIS ALGORITHM: RPR Ser Ql: NONREACTIVE

## 2024-06-01 LAB — CHLAMYDIA/GONOCOCCUS/TRICHOMONAS, NAA
Chlamydia by NAA: NEGATIVE
Gonococcus by NAA: NEGATIVE
Trich vag by NAA: NEGATIVE

## 2024-06-06 ENCOUNTER — Telehealth: Payer: Self-pay | Admitting: *Deleted

## 2024-06-06 NOTE — Progress Notes (Signed)
 Complex Care Management Note  Care Guide Note 06/06/2024 Name: Edward Wolfe MRN: [id number] DOB: 29-Aug-1986  [name] is a 38 y.o. year old male who sees Paseda, Folashade R, FNP for primary care. I reached out to [employer] by phone today to offer complex care management services.  Mr. Hang was given information about Complex Care Management services today including:   The Complex Care Management services include support from the care team which includes your Nurse Care Manager, Clinical Social Worker, or Pharmacist.  The Complex Care Management team is here to help remove barriers to the health concerns and goals most important to you. Complex Care Management services are voluntary, and the patient may decline or stop services at any time by request to their care team member.   Complex Care Management Consent Status: Patient agreed to services and verbal consent obtained.   Follow up plan:  Telephone appointment with complex care management team member scheduled for:  06/07/24  Encounter Outcome:  Patient Scheduled  Barnie Bora  [facility]  [facility], [facility] Guide  Direct Dial: [phone]  Fax [phone]

## 2024-06-07 ENCOUNTER — Other Ambulatory Visit: Payer: Self-pay

## 2024-06-07 NOTE — Patient Instructions (Signed)
 Visit Information  Thank you for taking time to visit with me today. Please don't hesitate to contact me if I can be of assistance to you before our next scheduled appointment.  Our next appointment is no further scheduled appointments.   Please call the care guide team at [phone] if you need to cancel or reschedule your appointment.   Following is a copy of your care plan:   Goals Addressed   None     Please call the Suicide and Crisis Lifeline: 988 call the [place]  National Suicide Prevention Lifeline: (743)442-7520 or TTY: 438-765-8557 TTY 340-260-4017) to talk to a trained counselor call 1-800-273-TALK (toll free, 24 hour hotline) go to [facility] [address] [place] [phone]) call 911 if you are experiencing a Mental Health or Behavioral Health Crisis or need someone to talk to.  Patient verbalizes understanding of instructions and care plan provided today and agrees to view in MyChart. Active MyChart status and patient understanding of how to access instructions and care plan via MyChart confirmed with patient.     Haven Lion, BSW [place]  [facility] Social Worker, [facility] [phone]

## 2024-06-07 NOTE — Patient Outreach (Signed)
 Complex Care Management   Visit Note  06/07/2024  Name:  Climmie Buelow MRN: [id number] DOB: Aug 26, 1986  Situation: Referral received for Complex Care Management related to SDOH Barriers:  Transportation Housing   Food insecurity Financial Resource Strain I obtained verbal consent from Patient.  Visit completed with patient  on the phone  Background:   Past Medical History:  Diagnosis Date   Amphetamine and psychostimulant-induced psychosis with delusions (HCC)    Bipolar 1 disorder (HCC)    Bipolar disorder (HCC) 10/29/2021   Bipolar I disorder, most recent episode depressed (HCC)    GAD (generalized anxiety disorder) 11/04/2021   Hepatitis C     Assessment: BSW outreached patient to assess SDOH barriers. BSW identified challenges with food insecurity, housing and financial strain. BSW has supplied patient with the corresponding resources.  SDOH Interventions Today    Flowsheet Row Most Recent Value  SDOH Interventions   Food Insecurity Interventions Community Resources Provided  Housing Interventions Community Resources Provided  Financial Strain Interventions Community Resources Provided      Recommendation:   No recommendations at this time.  Follow Up Plan:   Patient has met all care management goals. Care Management case will be closed. Patient has been provided contact information should new needs arise.   Haven Lion, BSW [place]  [facility] Social Worker, [facility] [phone]

## 2024-06-20 ENCOUNTER — Other Ambulatory Visit: Payer: Self-pay

## 2024-06-20 NOTE — Progress Notes (Signed)
 Specialty Pharmacy Refill Coordination Note  CD Althaus is a 38 y.o. male contacted today regarding refills of specialty medication(s) Emtricitabine -Tenofovir  DF (TRUVADA )   Patient requested Delivery   Delivery date: 06/21/24   Verified address: [address] [place] [address]   Medication will be filled on 06/20/24.

## 2024-07-07 ENCOUNTER — Other Ambulatory Visit: Payer: Self-pay

## 2024-07-07 ENCOUNTER — Other Ambulatory Visit (HOSPITAL_COMMUNITY): Payer: Self-pay

## 2024-07-10 ENCOUNTER — Other Ambulatory Visit (HOSPITAL_COMMUNITY): Payer: Self-pay

## 2024-07-17 NOTE — Therapy (Incomplete)
 OUTPATIENT PHYSICAL THERAPY THORACOLUMBAR EVALUATION   Patient Name: Edward Wolfe MRN: [id number] DOB:Mar 17, 1986, 38 y.o., male Today's Date: 07/17/2024  END OF SESSION:   Past Medical History:  Diagnosis Date   Amphetamine and psychostimulant-induced psychosis with delusions (HCC)    Bipolar 1 disorder (HCC)    Bipolar disorder (HCC) 10/29/2021   Bipolar I disorder, most recent episode depressed (HCC)    GAD (generalized anxiety disorder) 11/04/2021   Hepatitis C    Past Surgical History:  Procedure Laterality Date   HERNIA REPAIR     tube in ear     Patient Active Problem List   Diagnosis Date Noted   Food insecurity 05/30/2024   Homelessness 05/30/2024   Poor dental hygiene 01/24/2024   Impaired mobility and ADLs 11/26/2023   Need for influenza vaccination 11/01/2023   Anxiety 11/01/2023   Tobacco abuse 11/23/2022   On pre-exposure prophylaxis for HIV 11/23/2022   Need for diphtheria-tetanus-pertussis (Tdap) vaccine 11/23/2022   Marijuana abuse 11/23/2022   Chronic low back pain 10/01/2022   Dental infection 10/01/2022   Methamphetamine use disorder, moderate (HCC) 11/04/2021   Tobacco use disorder, moderate, dependence 11/04/2021   Bipolar 1 disorder, mixed, moderate (HCC) 01/17/2021   Substance induced mood disorder (HCC) 01/17/2021    PCP: Paseda, Folashade R, FNP  REFERRING PROVIDER: Barbra Con PARAS, MD  REFERRING DIAG: LUMBAGO WITH B/L RADICULAR SYMPTOMS  Rationale for Evaluation and Treatment: Rehabilitation  THERAPY DIAG:  No diagnosis found.  ONSET DATE: ***  SUBJECTIVE:                                                                                                                                                                                           SUBJECTIVE STATEMENT: ***  PERTINENT HISTORY:  psych/soc hx, hx hep c, ***   PAIN:  Are you having pain: *** Location/description: *** Best-worst over past week: ***  -  aggravating factors: *** - Easing factors: ***    PRECAUTIONS: hx hep C ***   RED FLAGS: {PT Red Flags:29287}   WEIGHT BEARING RESTRICTIONS: No  FALLS:  Has patient fallen in last 6 months? {fallsyesno:27318}  LIVING ENVIRONMENT: Lives with: {[facility] lives with:25569::lives with their family} Lives in: {Lives in:25570} Stairs: {opstairs:27293} Has following equipment at home: {Assistive devices:23999}  OCCUPATION: ***  PLOF: {PLOF:24004}  PATIENT GOALS: ***  NEXT MD VISIT: ***  OBJECTIVE:  Note: Objective measures were completed at Evaluation unless otherwise noted.  DIAGNOSTIC FINDINGS:  No recent spine imaging in chart MRI lumbar 2023: IMPRESSION: 1. No evidence of infection in the lumbar spine. 2. 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 3. Diffuse facet hypertrophy. 4. No spinal stenosis.  PATIENT SURVEYS:  ODI: ***   COGNITION: Overall cognitive status: Within functional limits for tasks assessed     SENSATION/NEURO: Light touch intact all extremities *** No clonus either LE Negative hoffmann and tromner sign BIL No ataxia with gait   LUMBAR ROM:   AROM eval  Flexion   Extension   Right lateral flexion   Left lateral flexion   Right rotation   Left rotation    (Blank rows = not tested) (Key: WFL = within functional limits not formally assessed, * = concordant pain, s = stiffness/stretching sensation, NT = not tested) Comment:   LOWER EXTREMITY ROM:     {AROM/PROM:27142}  Right eval Left eval  Hip flexion    Hip extension    Hip internal rotation    Hip external rotation    Knee extension    Knee flexion    (Blank rows = not tested) (Key: WFL = within functional limits not formally assessed, * = concordant pain, s = stiffness/stretching sensation, NT = not tested)  Comments:    LOWER EXTREMITY MMT:    MMT Right eval Left eval  Hip flexion    Hip abduction  (modified sitting)    Hip internal rotation    Hip external rotation    Knee flexion    Knee extension    Ankle dorsiflexion     (Blank rows = not tested) (Key: WFL = within functional limits not formally assessed, * = concordant pain, s = stiffness/stretching sensation, NT = not tested)  Comments:    LUMBAR SPECIAL TESTS:   Slump test:   R: ***   L: ***   FUNCTIONAL TESTS:  5xSTS/30secSTS: *** TUG: ***   GAIT: Distance walked: within clinic Assistive device utilized: {Assistive devices:23999} Level of assistance: {Levels of assistance:24026} Comments: ***   TREATMENT DATE:  [facility] Adult PT Treatment:                                                DATE: *** Therapeutic Exercise: *** Manual Therapy: *** Neuromuscular re-ed: *** Therapeutic Activity: *** Modalities: *** Self Care: ***                                                                                                                                  PATIENT EDUCATION:  Education details: Pt education on PT impairments, prognosis, and POC. Informed consent. Rationale for interventions, safe/appropriate HEP performance Person educated: Patient Education method: Explanation, Demonstration, Tactile cues, Verbal cues Education comprehension: verbalized understanding, returned demonstration, verbal cues required, tactile cues required, and needs further education    HOME EXERCISE PROGRAM: ***  ASSESSMENT:  CLINICAL IMPRESSION: Patient is a 38 y.o. gentleman who was seen today  for physical therapy evaluation and treatment for back pain. ***    OBJECTIVE IMPAIRMENTS: {opptimpairments:25111}.   ACTIVITY LIMITATIONS: {activitylimitations:27494}  PARTICIPATION LIMITATIONS: {participationrestrictions:25113}  PERSONAL FACTORS: Time since onset of injury/illness/exacerbation and psych/soc hx  are also affecting patient's functional outcome.   REHAB POTENTIAL: {rehabpotential:25112}  CLINICAL DECISION  MAKING: {clinical decision making:25114}  EVALUATION COMPLEXITY: {Evaluation complexity:25115}   GOALS: Goals reviewed with patient? {yes/no:20286}  SHORT TERM GOALS: Target date: ***  Pt will demonstrate appropriate understanding and performance of initially prescribed HEP in order to facilitate improved independence with management of symptoms.  Baseline: HEP ***  Goal status: INITIAL   2. Pt will report at least 25% improvement in overall pain levels over past week in order to facilitate improved tolerance to typical daily activities.   Baseline: ***  Goal status: INITIAL    LONG TERM GOALS: Target date: *** Pt will improve at least 20% on ODI in order to demonstrate improved perception of functional status due to symptoms.  Baseline: *** Goal status: INITIAL  2.  Pt will demonstrate *** lumbar AROM in order to demonstrate improved tolerance to functional movement patterns.   Baseline: *** Goal status: INITIAL  3.  Pt will demonstrate hip MMT of *** in order to demonstrate improved strength for functional movements.  Baseline: *** Goal status: INITIAL  4. Pt will perform 5xSTS in <*** sec in order to demonstrate reduced fall risk and improved functional independence. (MCID of 2.3sec)  Baseline: ***  Goal status: INITIAL   PLAN:  PT FREQUENCY: {rehab frequency:25116}  PT DURATION: {rehab duration:25117}  PLANNED INTERVENTIONS: {rehab planned interventions:25118::97110-Therapeutic exercises,97530- Therapeutic [phone]- Neuromuscular re-education,97535- Self Rjmz,02859- Manual therapy}.  PLAN FOR NEXT SESSION: Review/update HEP PRN. Work on [employer] exercises as appropriate with emphasis on ***. Symptom modification strategies as indicated/appropriate.    Alm DELENA Jenny PT, DPT 07/17/2024 4:38 PM

## 2024-07-18 ENCOUNTER — Other Ambulatory Visit: Payer: Self-pay

## 2024-07-18 ENCOUNTER — Other Ambulatory Visit (HOSPITAL_COMMUNITY): Payer: Self-pay

## 2024-07-18 ENCOUNTER — Ambulatory Visit: Payer: MEDICAID | Admitting: Physical Therapy

## 2024-07-20 ENCOUNTER — Other Ambulatory Visit: Payer: Self-pay

## 2024-07-27 ENCOUNTER — Ambulatory Visit: Payer: MEDICAID | Attending: Family Medicine | Admitting: Physical Therapy

## 2024-07-27 DIAGNOSIS — R2689 Other abnormalities of gait and mobility: Secondary | ICD-10-CM | POA: Insufficient documentation

## 2024-07-27 DIAGNOSIS — M5459 Other low back pain: Secondary | ICD-10-CM | POA: Insufficient documentation

## 2024-07-27 DIAGNOSIS — M6281 Muscle weakness (generalized): Secondary | ICD-10-CM | POA: Insufficient documentation

## 2024-07-27 DIAGNOSIS — M5416 Radiculopathy, lumbar region: Secondary | ICD-10-CM | POA: Insufficient documentation

## 2024-07-27 NOTE — Therapy (Deleted)
 OUTPATIENT PHYSICAL THERAPY THORACOLUMBAR EVALUATION  Patient Name: Edward Wolfe MRN: [id number] DOB:09-14-1986, 38 y.o., male Today's Date: 07/27/2024    Past Medical History:  Diagnosis Date   Amphetamine and psychostimulant-induced psychosis with delusions (HCC)    Bipolar 1 disorder (HCC)    Bipolar disorder (HCC) 10/29/2021   Bipolar I disorder, most recent episode depressed (HCC)    GAD (generalized anxiety disorder) 11/04/2021   Hepatitis C    Past Surgical History:  Procedure Laterality Date   HERNIA REPAIR     tube in ear     Patient Active Problem List   Diagnosis Date Noted   Food insecurity 05/30/2024   Homelessness 05/30/2024   Poor dental hygiene 01/24/2024   Impaired mobility and ADLs 11/26/2023   Need for influenza vaccination 11/01/2023   Anxiety 11/01/2023   Tobacco abuse 11/23/2022   On pre-exposure prophylaxis for HIV 11/23/2022   Need for diphtheria-tetanus-pertussis (Tdap) vaccine 11/23/2022   Marijuana abuse 11/23/2022   Chronic low back pain 10/01/2022   Dental infection 10/01/2022   Methamphetamine use disorder, moderate (HCC) 11/04/2021   Tobacco use disorder, moderate, dependence 11/04/2021   Bipolar 1 disorder, mixed, moderate (HCC) 01/17/2021   Substance induced mood disorder (HCC) 01/17/2021    PCP: Paseda, Folashade R, FNP  REFERRING PROVIDER: Barbra Con PARAS, *  THERAPY DIAG:  No diagnosis found.  REFERRING DIAG: low back pain   Rationale for Evaluation and Treatment:  Rehabilitation  SUBJECTIVE:  PERTINENT PAST HISTORY:  Bipolar 1, substance abuse        PRECAUTIONS: {Therapy precautions:24002}  WEIGHT BEARING RESTRICTIONS {Yes ***/No:24003}  FALLS:  Has patient fallen in last 6 months? {yes/no:20286}, Number of falls: ***  MOI/History of condition:  Onset date: ***  SUBJECTIVE STATEMENT  Edward Wolfe is a 38 y.o. male who presents to clinic with chief complaint of ***.  ***   Red flags:   {has/denies:26543} {kerredflag:26542}  Pain:  Are you having pain? {yes/no:20286} Pain location: *** NPRS scale:  Best: {NUMBERS; 0-10:5044}/10, Worst: {NUMBERS; 0-10:5044}/10 Aggravating factors: *** Relieving factors: *** Pain description: {PAIN DESCRIPTION:21022940}  Occupation: ***  Assistive Device: ***  Hand Dominance: ***  Patient Goals/Specific Activities: ***   OBJECTIVE:   DIAGNOSTIC FINDINGS:  ***  GENERAL OBSERVATION/GAIT: ***  SENSATION: Light touch: {intact/deficits:24005}  LUMBAR AROM  AROM AROM  (Eval)  Flexion {kerromlxflex:28296}  Extension {kerromcxlx:26716}  Right lateral flexion {kerromcxlx:26716}  Left lateral flexion {kerromcxlx:26716}  Right rotation {kerromcxlx:26716}  Left rotation {kerromcxlx:26716}    (Blank rows = not tested)   LE MMT:  MMT Right (Eval) Left (Eval)  Hip flexion (L2, L3) *** ***  Knee extension (L3) *** ***  Knee flexion    Hip abduction *** ***  Hip extension *** ***  Hip external rotation *** ***  Hip internal rotation *** ***  Hip adduction    Ankle dorsiflexion (L4)    Ankle plantarflexion (S1)    Ankle inversion    Ankle eversion    Great Toe ext (L5)    Grossly     (Blank rows = not tested, score listed is out of 5 possible points.  N = WNL, D = diminished, C = clear for gross weakness with myotome testing, * = concordant pain with testing)  SPECIAL TESTS:  Straight leg raise: L (***), R (***) Slump: L (***), R (***)  MUSCLE LENGTH: Hamstrings: Right {kerminsig:27227} restriction; Left {kerminsig:27227} restriction Hip flexors: Right {kerminsig:27227} restriction; Left {kerminsig:27227} restriction  LE ROM:  ROM Right (Eval) Left (Eval)  Hip flexion    Hip extension    Hip abduction    Hip adduction    Hip internal rotation    Hip external rotation    Knee flexion    Knee extension    Ankle dorsiflexion    Ankle plantarflexion    Ankle inversion    Ankle eversion       (Blank rows = not tested, N = WNL, * = concordant pain with testing)  Functional Tests  Eval                                                                PALPATION:   ***  PATIENT SURVEYS:  ODI: ***  TODAY'S TREATMENT  Therapeutic Exercise: Creating, reviewing, and completing below HEP  PATIENT EDUCATION ([facility]/HM):  POC, diagnosis, prognosis, HEP, and outcome measures.  Pt educated via explanation, demonstration, and handout (HEP).  Pt confirms understanding verbally.   HOME EXERCISE PROGRAM: ***  Treatment priorities   Eval                                                  ASSESSMENT:  CLINICAL IMPRESSION: Edward Wolfe is a 38 y.o. male who presents to clinic with signs and sxs consistent with ***.   ***.   Edward Wolfe will benefit from skilled PT to address relevant deficits and improve ***.   OBJECTIVE IMPAIRMENTS: Pain, ***  ACTIVITY LIMITATIONS: ***  PERSONAL FACTORS: See medical history and pertinent history   REHAB POTENTIAL: Good  CLINICAL DECISION MAKING: Evolving/moderate complexity  EVALUATION COMPLEXITY: Moderate   GOALS:   SHORT TERM GOALS: Target date: ***  Edward Wolfe will be >75% HEP compliant to improve carryover between sessions and facilitate independent management of condition  Evaluation: ongoing Goal status: INITIAL   LONG TERM GOALS: Target date: ***  Edward Wolfe will self report >/= 50% decrease in pain from evaluation to improve function in daily tasks  Evaluation/Baseline: ***/10 max pain Goal status: INITIAL   2.  Edward Wolfe will show a >/= *** pt improvement in their ODI score (MCID is 12% or 6/50 pts) as a proxy for functional improvement   Evaluation/Baseline: *** pts Goal status: INITIAL   3.  Edward Wolfe will be able to ***, not limited by pain  Evaluation/Baseline: limited Goal status: INITIAL   4.  ***   5.  ***   6.  ***   PLAN: PT FREQUENCY: 1-2x/week  PT DURATION: 8 weeks  PLANNED  INTERVENTIONS:  97164- PT Re-evaluation, 97110-Therapeutic exercises, 97530- Therapeutic activity, [id number]- Neuromuscular re-education, 97535- Self Care, 02859- Manual therapy, [id number]- Gait training, [id number]- Aquatic Therapy, [phone]- Electrical stimulation (manual), [id number]- Vasopneumatic device, [id number]- Traction (mechanical), [id number]- Ionotophoresis 4mg /ml Dexamethasone, Taping, Dry Needling, Joint manipulation, and Spinal manipulation.   Madalee Altmann PT, DPT 07/27/2024, 1:55 PM

## 2024-08-07 ENCOUNTER — Other Ambulatory Visit: Payer: Self-pay

## 2024-08-07 ENCOUNTER — Other Ambulatory Visit (HOSPITAL_COMMUNITY): Payer: Self-pay

## 2024-08-07 NOTE — Progress Notes (Signed)
 Specialty Pharmacy Refill Coordination Note  Edward Wolfe is a 38 y.o. male contacted today regarding refills of specialty medication(s) Emtricitabine -Tenofovir  DF (TRUVADA )   Patient requested Delivery   Delivery date: 08/14/24   Verified address: [address] [place] [address]   Medication will be filled on 08/11/24.

## 2024-08-10 ENCOUNTER — Other Ambulatory Visit: Payer: Self-pay

## 2024-08-11 ENCOUNTER — Other Ambulatory Visit: Payer: Self-pay

## 2024-08-15 ENCOUNTER — Ambulatory Visit: Payer: MEDICAID | Admitting: Physical Therapy

## 2024-08-15 ENCOUNTER — Other Ambulatory Visit (HOSPITAL_COMMUNITY): Payer: Self-pay

## 2024-08-15 ENCOUNTER — Encounter: Payer: Self-pay | Admitting: Physical Therapy

## 2024-08-15 ENCOUNTER — Other Ambulatory Visit: Payer: Self-pay

## 2024-08-15 DIAGNOSIS — M5416 Radiculopathy, lumbar region: Secondary | ICD-10-CM

## 2024-08-15 DIAGNOSIS — M5459 Other low back pain: Secondary | ICD-10-CM

## 2024-08-15 DIAGNOSIS — R2689 Other abnormalities of gait and mobility: Secondary | ICD-10-CM | POA: Diagnosis present

## 2024-08-15 DIAGNOSIS — M6281 Muscle weakness (generalized): Secondary | ICD-10-CM

## 2024-08-15 MED ORDER — MELOXICAM 15 MG PO TABS
15.0000 mg | ORAL_TABLET | Freq: Every day | ORAL | 1 refills | Status: DC | PRN
  Filled 2024-08-15: qty 30, 30d supply, fill #0
  Filled 2024-09-25: qty 30, 30d supply, fill #1

## 2024-08-15 NOTE — Therapy (Signed)
 OUTPATIENT PHYSICAL THERAPY THORACOLUMBAR EVALUATION   Patient Name: Edward Wolfe MRN: [id number] DOB:May 29, 1986, 38 y.o., male Today's Date: 08/15/2024  END OF SESSION:  PT End of Session - 08/15/24 1658     Visit Number 1    Number of Visits 7    Date for PT Re-Evaluation 09/26/24    PT Start Time 1615    PT Stop Time 1653    PT Time Calculation (min) 38 min    Activity Tolerance Patient tolerated treatment well          Past Medical History:  Diagnosis Date   Amphetamine and psychostimulant-induced psychosis with delusions (HCC)    Bipolar 1 disorder (HCC)    Bipolar disorder (HCC) 10/29/2021   Bipolar I disorder, most recent episode depressed (HCC)    GAD (generalized anxiety disorder) 11/04/2021   Hepatitis C    Past Surgical History:  Procedure Laterality Date   HERNIA REPAIR     tube in ear     Patient Active Problem List   Diagnosis Date Noted   Food insecurity 05/30/2024   Homelessness 05/30/2024   Poor dental hygiene 01/24/2024   Impaired mobility and ADLs 11/26/2023   Need for influenza vaccination 11/01/2023   Anxiety 11/01/2023   Tobacco abuse 11/23/2022   On pre-exposure prophylaxis for HIV 11/23/2022   Need for diphtheria-tetanus-pertussis (Tdap) vaccine 11/23/2022   Marijuana abuse 11/23/2022   Chronic low back pain 10/01/2022   Dental infection 10/01/2022   Methamphetamine use disorder, moderate (HCC) 11/04/2021   Tobacco use disorder, moderate, dependence 11/04/2021   Bipolar 1 disorder, mixed, moderate (HCC) 01/17/2021   Substance induced mood disorder (HCC) 01/17/2021    PCP: Paseda, Folashade R, FNP  REFERRING PROVIDER: Barbra Con PARAS, MD  REFERRING DIAG: LUMBAGO WITH B/L RADICULAR SYMPTOMS   Rationale for Evaluation and Treatment: Rehabilitation  THERAPY DIAG:  Other low back pain  Radiculopathy, lumbar region  Muscle weakness (generalized)  Other abnormalities of gait and mobility  PERTINENT HISTORY: Bipolar  1  WEIGHT BEARING RESTRICTIONS: No  FALLS:  Has patient fallen in last 6 months? Yes. Number of falls fell yesterday in the bathroom, RLE gave out.  LIVING ENVIRONMENT: Lives with: lives with their family and lives with an adult companion Lives in: House/apartment Stairs: No Has following equipment at home: Environmental consultant - 4 wheeled  OCCUPATION: not currently working   PRECAUTIONS: None ---------------------------------------------------------------------------------------------  SUBJECTIVE:                                                                                                                                                                                           SUBJECTIVE STATEMENT:  Eval statement 08/15/2024: has been dealing with LBP onset 4 years. Last year and a half has gotten worse. Recovering meth addict. Has nerve damage from years of abuse. Last 6 months have gotten much worse, constantly in pain, difficulties walking, n/t symptoms down BLE , occasionally as low as the feel R>L 7/10 pain currently, worsened by prolonged walking >5 minutes  RED FLAGS: None    PLOF: Independent  PATIENT GOALS: walk better, preferably without walker  NEXT MD VISIT: October 1st w/ murphy  ---------------------------------------------------------------------------------------------  OBJECTIVE:  Note: Objective measures were completed at Evaluation unless otherwise noted.  DIAGNOSTIC FINDINGS:  Can't see recent imaging from [facility]  PATIENT SURVEYS:  ODI: 28/50 (56%)  COGNITION: Overall cognitive status: Within functional limits for tasks assessed   PALPATION: TTP to sacrum, B paraspinals R>L  Lumbar contraction pattern  L Multifidus:poor quality  R Multifidus:poor quality   SENSATION: Light touch: Impaired   MUSCLE LENGTH: Hamstrings: Right 40d; deg; Left 50d deg  POSTURE: increased lumbar lordosis   LUMBAR ROM:   AROM eval  Flexion   Extension   Right lateral  flexion   Left lateral flexion   Right rotation   Left rotation    (Blank rows = not tested)  ! Indicates pain with testing  LOWER EXTREMITY ROM:     Active  Right eval Left eval  Hip flexion    Hip extension    Hip abduction    Hip adduction    Hip internal rotation    Hip external rotation    Knee flexion    Knee extension    Ankle dorsiflexion    Ankle plantarflexion    Ankle inversion    Ankle eversion     (Blank rows = not tested)  ! Indicates pain with testing  LOWER EXTREMITY MMT:    MMT Right eval Left eval  Hip flexion 3+ 4-  Hip extension 4 4  Hip abduction 4- 4-  Hip adduction    Hip internal rotation    Hip external rotation    Knee flexion 3+ 4-  Knee extension 3+ 4-  Ankle dorsiflexion    Ankle plantarflexion    Ankle inversion    Ankle eversion     (Blank rows = not tested)   ! Indicates pain with testing LUMBAR SPECIAL TESTS:  Prone instability test: Positive and Straight leg raise test: Positive  GAIT: Distance walked: 100' Assistive device utilized: Walker - 4 wheeled Level of assistance: SBA Comments: R foot drop, decreased B step length  [facility] Adult PT Treatment:                                                DATE: 08/15/2024 Self Care: Pt education POC discussion  PATIENT EDUCATION:  Education details: Pt received education regarding HEP performance, ADL performance, functional activity tolerance, impairment education, appropriate performance of therapeutic activities.  Person educated: Patient Education method: Explanation, Demonstration, Tactile cues, Verbal cues, and Handouts Education comprehension: verbalized understanding and returned demonstration  HOME EXERCISE PROGRAM: Access Code: [email or web address] URL: [email or web address] Date: 08/15/2024 Prepared by: Mabel Kiang  Exercises -  Seated Hamstring Stretch  - 1 x daily - 7 x weekly - 2 sets - 1 reps - 41m hold - Supine Bridge  - 1 x daily - 4 x weekly - 2-3 sets - 12 reps - 4s hold - Clamshell with Resistance  - 1 x daily - 4 x weekly - 3 sets - 12 reps - 2s hold - Supine Active Straight Leg Raise  - 1 x daily - 4 x weekly - 2-3 sets - 10 reps - 3s hold ---------------------------------------------------------------------------------------------  ASSESSMENT:  CLINICAL IMPRESSION: Eval impression (08/15/2024): Pt. attended today's physical therapy session for evaluation of LBP. Pt has complaints of 4 year onset LBP that has progressively gotten worse over the years, worsened the most in the last 6 months. Currently presenting with BLE n/t R>L, R foot drop and global BLE weakness. Pt has notable deficits and would benefit from therapeutic focus on BLE strengthening, gait quality, stance tolerance and general activity tolerance. It is also recommended get a referral to a neurologist for further diagnostic testing. Treatment performed today focused on pt education detailed in the objective. Additional education was given on symptom presentation and to reach out to emergency services should nay further symptoms develop such as saddle paresthesia or severe BLE pain alongside numbness. Pt demonstrated good understanding of education provided. required minimal v/t cues and stand by assistance for appropriate performance with today's activities. Pt requires the intervention of skilled outpatient physical therapy to address the aforementioned deficits and progress towards a functional level in line with therapeutic goals.    OBJECTIVE IMPAIRMENTS: Abnormal gait, decreased activity tolerance, decreased balance, decreased knowledge of condition, decreased mobility, difficulty walking, decreased ROM, decreased strength, decreased safety awareness, impaired tone, improper body mechanics, postural dysfunction, and pain.   ACTIVITY LIMITATIONS:  carrying, lifting, bending, sitting, standing, squatting, sleeping, stairs, transfers, bed mobility, and locomotion level  PARTICIPATION LIMITATIONS: meal prep, cleaning, laundry, community activity, and occupation  PERSONAL FACTORS: Fitness, Past/current experiences, Social background, and Time since onset of injury/illness/exacerbation are also affecting patient's functional outcome.   REHAB POTENTIAL: Fair see assessment  CLINICAL DECISION MAKING: Evolving/moderate complexity  EVALUATION COMPLEXITY: Moderate   GOALS: Goals reviewed with patient? YES  SHORT TERM GOALS: Target date: 09/05/2024  Pt will be independent with administered HEP to demonstrate the competency necessary for long term managemnet of symptoms at home. Baseline: Goal status: INITIAL   LONG TERM GOALS: Target date: 09/26/2024  Pt. Will achieve a MODI score of 22/50 as to demonstrate improvement in self-perceived functional ability with daily activities.  Baseline: 28/50 Goal status: INITIAL  2.  Pt will improve Global hip strength to a 4+/5 to demonstrate improvement in strength for quality of motion and activity performance.  Baseline:  Goal status: INITIAL  3.  Pt will independently ambulate 10 minutes with LRAD and less than 3/10 pain to demonstrate improved weightbearing tolerance, BLE strength, and functional capacity for community ambulation. Baseline: 5 minutes Goal status: INITIAL  4.  Pt will report the ability to stand for >/= 30 minutes as to demonstrate improved tolerance to standing for prolonged time and improved ability to participate in  ADLs.  Baseline: 15 minutes Goal status: INITIAL ---------------------------------------------------------------------------------------------  PLAN:  PT FREQUENCY: 1-2x/week  PT DURATION: 6 weeks  PLANNED INTERVENTIONS: 97110-Therapeutic exercises, 97530- Therapeutic activity, [id number]- Neuromuscular re-education, 97535- Self Care, 02859- Manual  therapy, [phone]- Gait training, [phone]- Aquatic Therapy, Patient/Family education, Balance training, Stair training, Taping, Joint mobilization, Spinal mobilization, and DME instructions.  PLAN FOR NEXT SESSION: Review HEP, Begin POC as detailed in assessment   Mabel Kiang, PT, DPT 08/15/2024, 4:59 PM   For all possible CPT codes, reference the Planned Interventions line above.     Check all conditions that are expected to impact treatment: {Conditions expected to impact treatment:Presence of Medical Equipment   If treatment provided at initial evaluation, no treatment charged due to lack of authorization.

## 2024-08-25 ENCOUNTER — Other Ambulatory Visit: Payer: Self-pay

## 2024-08-25 ENCOUNTER — Other Ambulatory Visit (HOSPITAL_COMMUNITY): Payer: Self-pay

## 2024-08-28 ENCOUNTER — Ambulatory Visit: Payer: MEDICAID | Attending: Family Medicine

## 2024-08-28 DIAGNOSIS — M6281 Muscle weakness (generalized): Secondary | ICD-10-CM | POA: Insufficient documentation

## 2024-08-28 DIAGNOSIS — M5416 Radiculopathy, lumbar region: Secondary | ICD-10-CM | POA: Insufficient documentation

## 2024-08-28 DIAGNOSIS — M5459 Other low back pain: Secondary | ICD-10-CM | POA: Insufficient documentation

## 2024-08-28 DIAGNOSIS — R2689 Other abnormalities of gait and mobility: Secondary | ICD-10-CM | POA: Insufficient documentation

## 2024-08-28 NOTE — Therapy (Deleted)
 OUTPATIENT PHYSICAL THERAPY THORACOLUMBAR EVALUATION   Patient Name: Edward Wolfe MRN: [id number] DOB:11/17/1986, 38 y.o., male Today's Date: 08/28/2024  END OF SESSION:    Past Medical History:  Diagnosis Date   Amphetamine and psychostimulant-induced psychosis with delusions (HCC)    Bipolar 1 disorder (HCC)    Bipolar disorder (HCC) 10/29/2021   Bipolar I disorder, most recent episode depressed (HCC)    GAD (generalized anxiety disorder) 11/04/2021   Hepatitis C    Past Surgical History:  Procedure Laterality Date   HERNIA REPAIR     tube in ear     Patient Active Problem List   Diagnosis Date Noted   Food insecurity 05/30/2024   Homelessness 05/30/2024   Poor dental hygiene 01/24/2024   Impaired mobility and ADLs 11/26/2023   Need for influenza vaccination 11/01/2023   Anxiety 11/01/2023   Tobacco abuse 11/23/2022   On pre-exposure prophylaxis for HIV 11/23/2022   Need for diphtheria-tetanus-pertussis (Tdap) vaccine 11/23/2022   Marijuana abuse 11/23/2022   Chronic low back pain 10/01/2022   Dental infection 10/01/2022   Methamphetamine use disorder, moderate (HCC) 11/04/2021   Tobacco use disorder, moderate, dependence 11/04/2021   Bipolar 1 disorder, mixed, moderate (HCC) 01/17/2021   Substance induced mood disorder (HCC) 01/17/2021    PCP: Paseda, Folashade R, FNP  REFERRING PROVIDER: Barbra Con PARAS, MD  REFERRING DIAG: LUMBAGO WITH B/L RADICULAR SYMPTOMS   Rationale for Evaluation and Treatment: Rehabilitation  THERAPY DIAG:  No diagnosis found.  PERTINENT HISTORY: Bipolar 1  WEIGHT BEARING RESTRICTIONS: No  FALLS:  Has patient fallen in last 6 months? Yes. Number of falls fell yesterday in the bathroom, RLE gave out.  LIVING ENVIRONMENT: Lives with: lives with their family and lives with an adult companion Lives in: House/apartment Stairs: No Has following equipment at home: Walker - 4 wheeled  OCCUPATION: not currently  working   PRECAUTIONS: None ---------------------------------------------------------------------------------------------  SUBJECTIVE:                                                                                                                                                                                           SUBJECTIVE STATEMENT: Eval statement 08/15/2024: has been dealing with LBP onset 4 years. Last year and a half has gotten worse. Recovering meth addict. Has nerve damage from years of abuse. Last 6 months have gotten much worse, constantly in pain, difficulties walking, n/t symptoms down BLE , occasionally as low as the feel R>L 7/10 pain currently, worsened by prolonged walking >5 minutes  RED FLAGS: None    PLOF: Independent  PATIENT GOALS: walk better, preferably without walker  NEXT MD VISIT: October  1st w/ murphy  ---------------------------------------------------------------------------------------------  OBJECTIVE:  Note: Objective measures were completed at Evaluation unless otherwise noted.  DIAGNOSTIC FINDINGS:  Can't see recent imaging from [facility]  PATIENT SURVEYS:  ODI: 28/50 (56%)  COGNITION: Overall cognitive status: Within functional limits for tasks assessed   PALPATION: TTP to sacrum, B paraspinals R>L  Lumbar contraction pattern  L Multifidus:poor quality  R Multifidus:poor quality   SENSATION: Light touch: Impaired   MUSCLE LENGTH: Hamstrings: Right 40d; deg; Left 50d deg  POSTURE: increased lumbar lordosis   LUMBAR ROM:   AROM eval  Flexion   Extension   Right lateral flexion   Left lateral flexion   Right rotation   Left rotation    (Blank rows = not tested)  ! Indicates pain with testing  LOWER EXTREMITY ROM:     Active  Right eval Left eval  Hip flexion    Hip extension    Hip abduction    Hip adduction    Hip internal rotation    Hip external rotation    Knee flexion    Knee extension    Ankle dorsiflexion     Ankle plantarflexion    Ankle inversion    Ankle eversion     (Blank rows = not tested)  ! Indicates pain with testing  LOWER EXTREMITY MMT:    MMT Right eval Left eval  Hip flexion 3+ 4-  Hip extension 4 4  Hip abduction 4- 4-  Hip adduction    Hip internal rotation    Hip external rotation    Knee flexion 3+ 4-  Knee extension 3+ 4-  Ankle dorsiflexion    Ankle plantarflexion    Ankle inversion    Ankle eversion     (Blank rows = not tested)   ! Indicates pain with testing LUMBAR SPECIAL TESTS:  Prone instability test: Positive and Straight leg raise test: Positive  GAIT: Distance walked: 100' Assistive device utilized: Environmental consultant - 4 wheeled Level of assistance: SBA Comments: R foot drop, decreased B step length  [facility] Adult PT Treatment:                                                DATE: 08/15/2024 Self Care: Pt education POC discussion                                                                                                                                PATIENT EDUCATION:  Education details: Pt received education regarding HEP performance, ADL performance, functional activity tolerance, impairment education, appropriate performance of therapeutic activities.  Person educated: Patient Education method: Explanation, Demonstration, Tactile cues, Verbal cues, and Handouts Education comprehension: verbalized understanding and returned demonstration  HOME EXERCISE PROGRAM: Access Code: [email or web address] URL: [email or web address] Date: 08/15/2024 Prepared by: Mabel Kiang  Exercises - Seated  Hamstring Stretch  - 1 x daily - 7 x weekly - 2 sets - 1 reps - 65m hold - Supine Bridge  - 1 x daily - 4 x weekly - 2-3 sets - 12 reps - 4s hold - Clamshell with Resistance  - 1 x daily - 4 x weekly - 3 sets - 12 reps - 2s hold - Supine Active Straight Leg Raise  - 1 x daily - 4 x weekly - 2-3 sets - 10 reps - 3s  hold ---------------------------------------------------------------------------------------------  ASSESSMENT:  CLINICAL IMPRESSION: Eval impression (08/15/2024): Pt. attended today's physical therapy session for evaluation of LBP. Pt has complaints of 4 year onset LBP that has progressively gotten worse over the years, worsened the most in the last 6 months. Currently presenting with BLE n/t R>L, R foot drop and global BLE weakness. Pt has notable deficits and would benefit from therapeutic focus on BLE strengthening, gait quality, stance tolerance and general activity tolerance. It is also recommended get a referral to a neurologist for further diagnostic testing. Treatment performed today focused on pt education detailed in the objective. Additional education was given on symptom presentation and to reach out to emergency services should nay further symptoms develop such as saddle paresthesia or severe BLE pain alongside numbness. Pt demonstrated good understanding of education provided. required minimal v/t cues and stand by assistance for appropriate performance with today's activities. Pt requires the intervention of skilled outpatient physical therapy to address the aforementioned deficits and progress towards a functional level in line with therapeutic goals.    OBJECTIVE IMPAIRMENTS: Abnormal gait, decreased activity tolerance, decreased balance, decreased knowledge of condition, decreased mobility, difficulty walking, decreased ROM, decreased strength, decreased safety awareness, impaired tone, improper body mechanics, postural dysfunction, and pain.   ACTIVITY LIMITATIONS: carrying, lifting, bending, sitting, standing, squatting, sleeping, stairs, transfers, bed mobility, and locomotion level  PARTICIPATION LIMITATIONS: meal prep, cleaning, laundry, community activity, and occupation  PERSONAL FACTORS: Fitness, Past/current experiences, Social background, and Time since onset of  injury/illness/exacerbation are also affecting patient's functional outcome.   REHAB POTENTIAL: Fair see assessment  CLINICAL DECISION MAKING: Evolving/moderate complexity  EVALUATION COMPLEXITY: Moderate   GOALS: Goals reviewed with patient? YES  SHORT TERM GOALS: Target date: 09/05/2024  Pt will be independent with administered HEP to demonstrate the competency necessary for long term managemnet of symptoms at home. Baseline: Goal status: INITIAL   LONG TERM GOALS: Target date: 09/26/2024  Pt. Will achieve a MODI score of 22/50 as to demonstrate improvement in self-perceived functional ability with daily activities.  Baseline: 28/50 Goal status: INITIAL  2.  Pt will improve Global hip strength to a 4+/5 to demonstrate improvement in strength for quality of motion and activity performance.  Baseline:  Goal status: INITIAL  3.  Pt will independently ambulate 10 minutes with LRAD and less than 3/10 pain to demonstrate improved weightbearing tolerance, BLE strength, and functional capacity for community ambulation. Baseline: 5 minutes Goal status: INITIAL  4.  Pt will report the ability to stand for >/= 30 minutes as to demonstrate improved tolerance to standing for prolonged time and improved ability to participate in ADLs.  Baseline: 15 minutes Goal status: INITIAL ---------------------------------------------------------------------------------------------  PLAN:  PT FREQUENCY: 1-2x/week  PT DURATION: 6 weeks  PLANNED INTERVENTIONS: 97110-Therapeutic exercises, 97530- Therapeutic activity, [id number]- Neuromuscular re-education, 97535- Self Care, 02859- Manual therapy, [phone]- Gait training, [phone]- Aquatic Therapy, Patient/Family education, Balance training, Stair training, Taping, Joint mobilization, Spinal mobilization, and DME instructions.  PLAN FOR NEXT SESSION: Review  HEP, Begin POC as detailed in assessment   Mabel Kiang, PT, DPT 08/28/2024, 7:45 AM   For all  possible CPT codes, reference the Planned Interventions line above.     Check all conditions that are expected to impact treatment: {Conditions expected to impact treatment:Presence of Medical Equipment   If treatment provided at initial evaluation, no treatment charged due to lack of authorization.

## 2024-08-30 ENCOUNTER — Ambulatory Visit: Payer: Self-pay | Admitting: Nurse Practitioner

## 2024-09-01 ENCOUNTER — Other Ambulatory Visit (HOSPITAL_COMMUNITY): Payer: Self-pay

## 2024-09-04 ENCOUNTER — Ambulatory Visit: Payer: MEDICAID | Admitting: Physical Therapy

## 2024-09-04 ENCOUNTER — Encounter: Payer: Self-pay | Admitting: Physical Therapy

## 2024-09-04 DIAGNOSIS — R2689 Other abnormalities of gait and mobility: Secondary | ICD-10-CM

## 2024-09-04 DIAGNOSIS — M5416 Radiculopathy, lumbar region: Secondary | ICD-10-CM

## 2024-09-04 DIAGNOSIS — M5459 Other low back pain: Secondary | ICD-10-CM

## 2024-09-04 DIAGNOSIS — M6281 Muscle weakness (generalized): Secondary | ICD-10-CM

## 2024-09-04 NOTE — Therapy (Addendum)
 OUTPATIENT PHYSICAL THERAPY THORACOLUMBAR   PHYSICAL THERAPY DISCHARGE SUMMARY  Visits from Start of Care: 4  Current functional level related to goals / functional outcomes: See goals   Remaining deficits: Current status unknown   Education / Equipment: HEP   Patient agrees to discharge. Patient goals were not met. Patient is being discharged due to not returning since the last visit.  Joneen Fresh PT, DPT, LAT, ATC  10/20/24  7:50 AM     Patient Name: Edward Wolfe MRN: [id number] DOB:12/28/1985, 38 y.o., male Today's Date: 09/04/2024  END OF SESSION:  PT End of Session - 09/04/24 1454     Visit Number 2    Number of Visits 7    Date for PT Re-Evaluation 09/26/24    PT Start Time 1453    PT Stop Time 1523    PT Time Calculation (min) 30 min    Activity Tolerance Patient tolerated treatment well    Behavior During Therapy [facility] for tasks assessed/performed           Past Medical History:  Diagnosis Date   Amphetamine and psychostimulant-induced psychosis with delusions (HCC)    Bipolar 1 disorder (HCC)    Bipolar disorder (HCC) 10/29/2021   Bipolar I disorder, most recent episode depressed (HCC)    GAD (generalized anxiety disorder) 11/04/2021   Hepatitis C    Past Surgical History:  Procedure Laterality Date   HERNIA REPAIR     tube in ear     Patient Active Problem List   Diagnosis Date Noted   Food insecurity 05/30/2024   Homelessness 05/30/2024   Poor dental hygiene 01/24/2024   Impaired mobility and ADLs 11/26/2023   Need for influenza vaccination 11/01/2023   Anxiety 11/01/2023   Tobacco abuse 11/23/2022   On pre-exposure prophylaxis for HIV 11/23/2022   Need for diphtheria-tetanus-pertussis (Tdap) vaccine 11/23/2022   Marijuana abuse 11/23/2022   Chronic low back pain 10/01/2022   Dental infection 10/01/2022   Methamphetamine use disorder, moderate (HCC) 11/04/2021   Tobacco use disorder, moderate, dependence 11/04/2021   Bipolar 1  disorder, mixed, moderate (HCC) 01/17/2021   Substance induced mood disorder (HCC) 01/17/2021    PCP: Paseda, Folashade R, FNP  REFERRING PROVIDER: Barbra Con PARAS, MD  REFERRING DIAG: LUMBAGO WITH B/L RADICULAR SYMPTOMS   Rationale for Evaluation and Treatment: Rehabilitation  THERAPY DIAG:  Other low back pain  Radiculopathy, lumbar region  Muscle weakness (generalized)  Other abnormalities of gait and mobility  PERTINENT HISTORY: Bipolar 1  WEIGHT BEARING RESTRICTIONS: No  FALLS:  Has patient fallen in last 6 months? Yes. Number of falls fell yesterday in the bathroom, RLE gave out.  LIVING ENVIRONMENT: Lives with: lives with their family and lives with an adult companion Lives in: House/apartment Stairs: No Has following equipment at home: Walker - 4 wheeled  OCCUPATION: not currently working   PRECAUTIONS: None ---------------------------------------------------------------------------------------------  SUBJECTIVE:  SUBJECTIVE STATEMENT: Pt attended today's session with reports of 7/10 pain. Pt stated that they have maintained good compliance with current HEP.  Feels like pain is worse, has been performing exercises every day for about an hour a day.   Eval statement 08/15/2024: has been dealing with LBP onset 4 years. Last year and a half has gotten worse. Recovering meth addict. Has nerve damage from years of abuse. Last 6 months have gotten much worse, constantly in pain, difficulties walking, n/t symptoms down BLE , occasionally as low as the feel R>L 7/10 pain currently, worsened by prolonged walking >5 minutes  RED FLAGS: None    PLOF: Independent  PATIENT GOALS: walk better, preferably without walker  NEXT MD VISIT: October 1st w/ murphy   ---------------------------------------------------------------------------------------------  OBJECTIVE:  Note: Objective measures were completed at Evaluation unless otherwise noted.  DIAGNOSTIC FINDINGS:  Can't see recent imaging from [facility]  PATIENT SURVEYS:  ODI: 28/50 (56%)  COGNITION: Overall cognitive status: Within functional limits for tasks assessed   PALPATION: TTP to sacrum, B paraspinals R>L  Lumbar contraction pattern  L Multifidus:poor quality  R Multifidus:poor quality   SENSATION: Light touch: Impaired   MUSCLE LENGTH: Hamstrings: Right 40d; deg; Left 50d deg  POSTURE: increased lumbar lordosis   LUMBAR ROM:   AROM eval  Flexion   Extension   Right lateral flexion   Left lateral flexion   Right rotation   Left rotation    (Blank rows = not tested)  ! Indicates pain with testing  LOWER EXTREMITY ROM:     Active  Right eval Left eval  Hip flexion    Hip extension    Hip abduction    Hip adduction    Hip internal rotation    Hip external rotation    Knee flexion    Knee extension    Ankle dorsiflexion    Ankle plantarflexion    Ankle inversion    Ankle eversion     (Blank rows = not tested)  ! Indicates pain with testing  LOWER EXTREMITY MMT:    MMT Right eval Left eval  Hip flexion 3+ 4-  Hip extension 4 4  Hip abduction 4- 4-  Hip adduction    Hip internal rotation    Hip external rotation    Knee flexion 3+ 4-  Knee extension 3+ 4-  Ankle dorsiflexion    Ankle plantarflexion    Ankle inversion    Ankle eversion     (Blank rows = not tested)   ! Indicates pain with testing LUMBAR SPECIAL TESTS:  Prone instability test: Positive and Straight leg raise test: Positive  GAIT: Distance walked: 100' Assistive device utilized: Environmental Consultant - 4 wheeled Level of assistance: SBA Comments: R foot drop, decreased B step length [facility] Adult PT Treatment:                                                DATE: 09/04/2024 Therapeutic  Activity: NuStep 5' for activity tolerance Pball T rolls 1x12 Supine marching 1x12B, 2s hold Supine LTR 1x12B, 2s hold  [facility] Adult PT Treatment:  DATE: 08/15/2024 Self Care: Pt education POC discussion                                                                                                                                PATIENT EDUCATION:  Education details: Pt received education regarding HEP performance, ADL performance, functional activity tolerance, impairment education, appropriate performance of therapeutic activities.  Person educated: Patient Education method: Explanation, Demonstration, Tactile cues, Verbal cues, and Handouts Education comprehension: verbalized understanding and returned demonstration  HOME EXERCISE PROGRAM: Access Code: [email or web address] URL: [email or web address] Date: 08/15/2024 Prepared by: Mabel Kiang  Exercises - Seated Hamstring Stretch  - 1 x daily - 7 x weekly - 2 sets - 1 reps - 26m hold - Supine Bridge  - 1 x daily - 4 x weekly - 2-3 sets - 12 reps - 4s hold - Clamshell with Resistance  - 1 x daily - 4 x weekly - 3 sets - 12 reps - 2s hold - Supine Active Straight Leg Raise  - 1 x daily - 4 x weekly - 2-3 sets - 10 reps - 3s hold ---------------------------------------------------------------------------------------------  ASSESSMENT:  CLINICAL IMPRESSION: Pt attended physical therapy session late for continuation of treatment regarding LBP . Today's treatment focused on improvement of  pain management, posterior chain motility/stability. Pt is reporting worsening symptoms since eval and is not sure if he wants to continue with PT. Time was take to discuss and educate pt regarding efficacy of PT and other potential options should he choose not to continue. and  Pt showed poor tolerance to administered treatment with no adverse effects by the end of session. Skilled intervention was  utilized via activity modification for pt tolerance with task completion, functional progression/regression promoting best outcomes inline with current rehab goals, as well as minimal verbal/tactile cuing alongside stand by physical assistance for safe and appropriate performance of today's activities. Pt is not safe to ambulate more than one step without UE a from FWW or furniture surfing. Progress as tolerated with lumbar stability, posterior chain motility, activity tolerance and pain management PRN.  Eval impression (08/15/2024): Pt. attended today's physical therapy session for evaluation of LBP. Pt has complaints of 4 year onset LBP that has progressively gotten worse over the years, worsened the most in the last 6 months. Currently presenting with BLE n/t R>L, R foot drop and global BLE weakness. Pt has notable deficits and would benefit from therapeutic focus on BLE strengthening, gait quality, stance tolerance and general activity tolerance. It is also recommended get a referral to a neurologist for further diagnostic testing. Treatment performed today focused on pt education detailed in the objective. Additional education was given on symptom presentation and to reach out to emergency services should nay further symptoms develop such as saddle paresthesia or severe BLE pain alongside numbness. Pt demonstrated good understanding of education provided. required minimal v/t cues and stand by assistance for appropriate performance with today's activities. Pt requires the  intervention of skilled outpatient physical therapy to address the aforementioned deficits and progress towards a functional level in line with therapeutic goals.    OBJECTIVE IMPAIRMENTS: Abnormal gait, decreased activity tolerance, decreased balance, decreased knowledge of condition, decreased mobility, difficulty walking, decreased ROM, decreased strength, decreased safety awareness, impaired tone, improper body mechanics, postural  dysfunction, and pain.   ACTIVITY LIMITATIONS: carrying, lifting, bending, sitting, standing, squatting, sleeping, stairs, transfers, bed mobility, and locomotion level  PARTICIPATION LIMITATIONS: meal prep, cleaning, laundry, community activity, and occupation  PERSONAL FACTORS: Fitness, Past/current experiences, Social background, and Time since onset of injury/illness/exacerbation are also affecting patient's functional outcome.   REHAB POTENTIAL: Fair see assessment  CLINICAL DECISION MAKING: Evolving/moderate complexity  EVALUATION COMPLEXITY: Moderate   GOALS: Goals reviewed with patient? YES  SHORT TERM GOALS: Target date: 09/05/2024  Pt will be independent with administered HEP to demonstrate the competency necessary for long term managemnet of symptoms at home. Baseline: Goal status: INITIAL   LONG TERM GOALS: Target date: 09/26/2024  Pt. Will achieve a MODI score of 22/50 as to demonstrate improvement in self-perceived functional ability with daily activities.  Baseline: 28/50 Goal status: INITIAL  2.  Pt will improve Global hip strength to a 4+/5 to demonstrate improvement in strength for quality of motion and activity performance.  Baseline:  Goal status: INITIAL  3.  Pt will independently ambulate 10 minutes with LRAD and less than 3/10 pain to demonstrate improved weightbearing tolerance, BLE strength, and functional capacity for community ambulation. Baseline: 5 minutes Goal status: INITIAL  4.  Pt will report the ability to stand for >/= 30 minutes as to demonstrate improved tolerance to standing for prolonged time and improved ability to participate in ADLs.  Baseline: 15 minutes Goal status: INITIAL ---------------------------------------------------------------------------------------------  PLAN:  PT FREQUENCY: 1-2x/week  PT DURATION: 6 weeks  PLANNED INTERVENTIONS: 97110-Therapeutic exercises, 97530- Therapeutic activity, [id number]- Neuromuscular  re-education, 97535- Self Care, 02859- Manual therapy, [phone]- Gait training, [phone]- Aquatic Therapy, Patient/Family education, Balance training, Stair training, Taping, Joint mobilization, Spinal mobilization, and DME instructions.  PLAN FOR NEXT SESSION:  Progress as tolerated with lumbar stability, posterior chain motility, activity tolerance and pain management PRN.   Mabel Kiang, PT, DPT 09/04/2024, 3:23 PM   For all possible CPT codes, reference the Planned Interventions line above.     Check all conditions that are expected to impact treatment: {Conditions expected to impact treatment:Presence of Medical Equipment   If treatment provided at initial evaluation, no treatment charged due to lack of authorization.

## 2024-09-05 ENCOUNTER — Other Ambulatory Visit (HOSPITAL_COMMUNITY): Payer: Self-pay

## 2024-09-11 ENCOUNTER — Other Ambulatory Visit: Payer: Self-pay

## 2024-09-11 ENCOUNTER — Ambulatory Visit: Payer: MEDICAID

## 2024-09-13 ENCOUNTER — Other Ambulatory Visit: Payer: Self-pay

## 2024-09-13 ENCOUNTER — Other Ambulatory Visit (HOSPITAL_COMMUNITY): Payer: Self-pay

## 2024-09-20 ENCOUNTER — Ambulatory Visit: Payer: MEDICAID

## 2024-09-21 ENCOUNTER — Ambulatory Visit: Payer: MEDICAID | Attending: Family Medicine | Admitting: Physical Therapy

## 2024-09-21 NOTE — Therapy (Deleted)
 OUTPATIENT PHYSICAL THERAPY THORACOLUMBAR EVALUATION   Patient Name: Edward Wolfe MRN: [id number] DOB:April 14, 1986, 38 y.o., male Today's Date: 09/21/2024  END OF SESSION:     Past Medical History:  Diagnosis Date   Amphetamine and psychostimulant-induced psychosis with delusions (HCC)    Bipolar 1 disorder (HCC)    Bipolar disorder (HCC) 10/29/2021   Bipolar I disorder, most recent episode depressed (HCC)    GAD (generalized anxiety disorder) 11/04/2021   Hepatitis C    Past Surgical History:  Procedure Laterality Date   HERNIA REPAIR     tube in ear     Patient Active Problem List   Diagnosis Date Noted   Food insecurity 05/30/2024   Homelessness 05/30/2024   Poor dental hygiene 01/24/2024   Impaired mobility and ADLs 11/26/2023   Need for influenza vaccination 11/01/2023   Anxiety 11/01/2023   Tobacco abuse 11/23/2022   On pre-exposure prophylaxis for HIV 11/23/2022   Need for diphtheria-tetanus-pertussis (Tdap) vaccine 11/23/2022   Marijuana abuse 11/23/2022   Chronic low back pain 10/01/2022   Dental infection 10/01/2022   Methamphetamine use disorder, moderate (HCC) 11/04/2021   Tobacco use disorder, moderate, dependence 11/04/2021   Bipolar 1 disorder, mixed, moderate (HCC) 01/17/2021   Substance induced mood disorder (HCC) 01/17/2021    PCP: Paseda, Folashade R, FNP  REFERRING PROVIDER: Barbra Con PARAS, MD  REFERRING DIAG: LUMBAGO WITH B/L RADICULAR SYMPTOMS   Rationale for Evaluation and Treatment: Rehabilitation  THERAPY DIAG:  No diagnosis found.  PERTINENT HISTORY: Bipolar 1  WEIGHT BEARING RESTRICTIONS: No  FALLS:  Has patient fallen in last 6 months? Yes. Number of falls fell yesterday in the bathroom, RLE gave out.  LIVING ENVIRONMENT: Lives with: lives with their family and lives with an adult companion Lives in: House/apartment Stairs: No Has following equipment at home: Environmental consultant - 4 wheeled  OCCUPATION: not currently  working   PRECAUTIONS: None ---------------------------------------------------------------------------------------------  SUBJECTIVE:                                                                                                                                                                                           SUBJECTIVE STATEMENT: *** Pt attended today's session with reports of 7/10 pain. Pt stated that they have maintained good compliance with current HEP.  Feels like pain is worse, has been performing exercises every day for about an hour a day.   Eval statement 08/15/2024: has been dealing with LBP onset 4 years. Last year and a half has gotten worse. Recovering meth addict. Has nerve damage from years of abuse. Last 6 months have gotten much worse, constantly in pain, difficulties walking, n/t  symptoms down BLE , occasionally as low as the feel R>L 7/10 pain currently, worsened by prolonged walking >5 minutes  RED FLAGS: None    PLOF: Independent  PATIENT GOALS: walk better, preferably without walker  NEXT MD VISIT: October 1st w/ murphy  ---------------------------------------------------------------------------------------------  OBJECTIVE:  Note: Objective measures were completed at Evaluation unless otherwise noted.  DIAGNOSTIC FINDINGS:  Can't see recent imaging from [facility]  PATIENT SURVEYS:  ODI: 28/50 (56%)  COGNITION: Overall cognitive status: Within functional limits for tasks assessed   PALPATION: TTP to sacrum, B paraspinals R>L  Lumbar contraction pattern  L Multifidus:poor quality  R Multifidus:poor quality   SENSATION: Light touch: Impaired   MUSCLE LENGTH: Hamstrings: Right 40d; deg; Left 50d deg  POSTURE: increased lumbar lordosis   LUMBAR ROM:   AROM eval  Flexion   Extension   Right lateral flexion   Left lateral flexion   Right rotation   Left rotation    (Blank rows = not tested)  ! Indicates pain with testing  LOWER  EXTREMITY ROM:     Active  Right eval Left eval  Hip flexion    Hip extension    Hip abduction    Hip adduction    Hip internal rotation    Hip external rotation    Knee flexion    Knee extension    Ankle dorsiflexion    Ankle plantarflexion    Ankle inversion    Ankle eversion     (Blank rows = not tested)  ! Indicates pain with testing  LOWER EXTREMITY MMT:    MMT Right eval Left eval  Hip flexion 3+ 4-  Hip extension 4 4  Hip abduction 4- 4-  Hip adduction    Hip internal rotation    Hip external rotation    Knee flexion 3+ 4-  Knee extension 3+ 4-  Ankle dorsiflexion    Ankle plantarflexion    Ankle inversion    Ankle eversion     (Blank rows = not tested)   ! Indicates pain with testing LUMBAR SPECIAL TESTS:  Prone instability test: Positive and Straight leg raise test: Positive  GAIT: Distance walked: 100' Assistive device utilized: Environmental consultant - 4 wheeled Level of assistance: SBA Comments: R foot drop, decreased B step length [facility] Adult PT Treatment:                                                DATE: 09/04/2024 Therapeutic Activity: NuStep 5' for activity tolerance Pball T rolls 1x12 Supine marching 1x12B, 2s hold Supine LTR 1x12B, 2s hold  [facility] Adult PT Treatment:                                                DATE: 08/15/2024 Self Care: Pt education POC discussion  PATIENT EDUCATION:  Education details: Pt received education regarding HEP performance, ADL performance, functional activity tolerance, impairment education, appropriate performance of therapeutic activities.  Person educated: Patient Education method: Explanation, Demonstration, Tactile cues, Verbal cues, and Handouts Education comprehension: verbalized understanding and returned demonstration  HOME EXERCISE PROGRAM: Access Code: [email or web address] URL:  [email or web address] Date: 08/15/2024 Prepared by: Mabel Kiang  Exercises - Seated Hamstring Stretch  - 1 x daily - 7 x weekly - 2 sets - 1 reps - 46m hold - Supine Bridge  - 1 x daily - 4 x weekly - 2-3 sets - 12 reps - 4s hold - Clamshell with Resistance  - 1 x daily - 4 x weekly - 3 sets - 12 reps - 2s hold - Supine Active Straight Leg Raise  - 1 x daily - 4 x weekly - 2-3 sets - 10 reps - 3s hold ---------------------------------------------------------------------------------------------  ASSESSMENT:  CLINICAL IMPRESSION: *** Pt attended physical therapy session late for continuation of treatment regarding LBP . Today's treatment focused on improvement of  pain management, posterior chain motility/stability. Pt is reporting worsening symptoms since eval and is not sure if he wants to continue with PT. Time was take to discuss and educate pt regarding efficacy of PT and other potential options should he choose not to continue. and  Pt showed poor tolerance to administered treatment with no adverse effects by the end of session. Skilled intervention was utilized via activity modification for pt tolerance with task completion, functional progression/regression promoting best outcomes inline with current rehab goals, as well as minimal verbal/tactile cuing alongside stand by physical assistance for safe and appropriate performance of today's activities. Pt is not safe to ambulate more than one step without UE a from FWW or furniture surfing. Progress as tolerated with lumbar stability, posterior chain motility, activity tolerance and pain management PRN.  Eval impression (08/15/2024): Pt. attended today's physical therapy session for evaluation of LBP. Pt has complaints of 4 year onset LBP that has progressively gotten worse over the years, worsened the most in the last 6 months. Currently presenting with BLE n/t R>L, R foot drop and global BLE weakness. Pt has notable deficits and  would benefit from therapeutic focus on BLE strengthening, gait quality, stance tolerance and general activity tolerance. It is also recommended get a referral to a neurologist for further diagnostic testing. Treatment performed today focused on pt education detailed in the objective. Additional education was given on symptom presentation and to reach out to emergency services should nay further symptoms develop such as saddle paresthesia or severe BLE pain alongside numbness. Pt demonstrated good understanding of education provided. required minimal v/t cues and stand by assistance for appropriate performance with today's activities. Pt requires the intervention of skilled outpatient physical therapy to address the aforementioned deficits and progress towards a functional level in line with therapeutic goals.    OBJECTIVE IMPAIRMENTS: Abnormal gait, decreased activity tolerance, decreased balance, decreased knowledge of condition, decreased mobility, difficulty walking, decreased ROM, decreased strength, decreased safety awareness, impaired tone, improper body mechanics, postural dysfunction, and pain.   ACTIVITY LIMITATIONS: carrying, lifting, bending, sitting, standing, squatting, sleeping, stairs, transfers, bed mobility, and locomotion level  PARTICIPATION LIMITATIONS: meal prep, cleaning, laundry, community activity, and occupation  PERSONAL FACTORS: Fitness, Past/current experiences, Social background, and Time since onset of injury/illness/exacerbation are also affecting patient's functional outcome.   REHAB POTENTIAL: Fair see assessment  CLINICAL DECISION MAKING: Evolving/moderate complexity  EVALUATION COMPLEXITY: Moderate   GOALS: Goals reviewed with patient? YES  SHORT TERM GOALS: Target date: 09/05/2024  Pt will be independent with administered HEP to demonstrate the competency necessary for long term managemnet of symptoms at home. Baseline: Goal status: INITIAL   LONG  TERM GOALS: Target date: 09/26/2024  Pt. Will achieve a MODI score of 22/50 as to demonstrate improvement in self-perceived functional ability with daily activities.  Baseline: 28/50 Goal status: INITIAL  2.  Pt will improve Global hip strength to a 4+/5 to demonstrate improvement in strength for quality of motion and activity performance.  Baseline:  Goal status: INITIAL  3.  Pt will independently ambulate 10 minutes with LRAD and less than 3/10 pain to demonstrate improved weightbearing tolerance, BLE strength, and functional capacity for community ambulation. Baseline: 5 minutes Goal status: INITIAL  4.  Pt will report the ability to stand for >/= 30 minutes as to demonstrate improved tolerance to standing for prolonged time and improved ability to participate in ADLs.  Baseline: 15 minutes Goal status: INITIAL ---------------------------------------------------------------------------------------------  PLAN:  PT FREQUENCY: 1-2x/week  PT DURATION: 6 weeks  PLANNED INTERVENTIONS: 97110-Therapeutic exercises, 97530- Therapeutic activity, [id number]- Neuromuscular re-education, 97535- Self Care, [id number]- Manual therapy, [phone]- Gait training, [phone]- Aquatic Therapy, Patient/Family education, Balance training, Stair training, Taping, Joint mobilization, Spinal mobilization, and DME instructions.  PLAN FOR NEXT SESSION:  Progress as tolerated with lumbar stability, posterior chain motility, activity tolerance and pain management PRN.   Dearis Danis April Ma L Hodan Wurtz, PT, DPT 09/21/2024, 1:11 PM   For all possible CPT codes, reference the Planned Interventions line above.     Check all conditions that are expected to impact treatment: {Conditions expected to impact treatment:Presence of Medical Equipment   If treatment provided at initial evaluation, no treatment charged due to lack of authorization.

## 2024-09-25 ENCOUNTER — Other Ambulatory Visit: Payer: Self-pay | Admitting: Nurse Practitioner

## 2024-09-25 ENCOUNTER — Other Ambulatory Visit: Payer: Self-pay

## 2024-09-25 ENCOUNTER — Other Ambulatory Visit (HOSPITAL_COMMUNITY): Payer: Self-pay

## 2024-09-25 DIAGNOSIS — F1994 Other psychoactive substance use, unspecified with psychoactive substance-induced mood disorder: Secondary | ICD-10-CM

## 2024-09-25 MED ORDER — SERTRALINE HCL 100 MG PO TABS
100.0000 mg | ORAL_TABLET | Freq: Every day | ORAL | 1 refills | Status: DC
  Filled 2024-09-25: qty 60, 60d supply, fill #0
  Filled 2024-11-13: qty 60, 60d supply, fill #1

## 2024-09-25 NOTE — Telephone Encounter (Signed)
 Please advise [facility]

## 2024-09-28 ENCOUNTER — Other Ambulatory Visit: Payer: Self-pay

## 2024-10-10 ENCOUNTER — Ambulatory Visit: Payer: Self-pay | Admitting: Nurse Practitioner

## 2024-10-23 ENCOUNTER — Ambulatory Visit: Payer: Self-pay | Admitting: Nurse Practitioner

## 2024-10-26 ENCOUNTER — Other Ambulatory Visit: Payer: Self-pay

## 2024-10-30 ENCOUNTER — Other Ambulatory Visit: Payer: Self-pay

## 2024-11-13 ENCOUNTER — Other Ambulatory Visit: Payer: Self-pay | Admitting: Nurse Practitioner

## 2024-11-13 ENCOUNTER — Other Ambulatory Visit: Payer: Self-pay

## 2024-11-13 ENCOUNTER — Other Ambulatory Visit (HOSPITAL_COMMUNITY): Payer: Self-pay

## 2024-11-13 DIAGNOSIS — F1994 Other psychoactive substance use, unspecified with psychoactive substance-induced mood disorder: Secondary | ICD-10-CM

## 2024-11-13 DIAGNOSIS — G8929 Other chronic pain: Secondary | ICD-10-CM

## 2024-11-13 DIAGNOSIS — F3162 Bipolar disorder, current episode mixed, moderate: Secondary | ICD-10-CM

## 2024-11-13 NOTE — Progress Notes (Signed)
 Specialty Pharmacy Refill Coordination Note  CD Edward Wolfe is a 38 y.o. male contacted today regarding refills of specialty medication(s) Emtricitabine -Tenofovir  DF (TRUVADA )   Patient requested Delivery   Delivery date: 11/14/24   Verified address: [address] [place] [address]   Medication will be filled on: 11/13/24

## 2024-11-13 NOTE — Progress Notes (Signed)
 Specialty Pharmacy Ongoing Clinical Assessment Note  Edward Wolfe is a 38 y.o. male who is being followed by the specialty pharmacy service for RxSp HIV PrEP   Patient's specialty medication(s) reviewed today: No data recorded  Missed doses in the last 4 weeks: No data recorded  Patient/Caregiver did not have any additional questions or concerns.   Therapeutic benefit summary: Patient is achieving benefit   Adverse events/side effects summary: No adverse events/side effects   Patient's therapy is appropriate to: Continue    Goals Addressed             This Visit's Progress    Maintain optimal adherence to therapy   On track    Patient is on track. Patient will maintain adherence         Follow up: 12 months  [facility]

## 2024-11-14 ENCOUNTER — Other Ambulatory Visit (HOSPITAL_COMMUNITY): Payer: Self-pay

## 2024-11-14 ENCOUNTER — Other Ambulatory Visit: Payer: Self-pay

## 2024-11-14 MED ORDER — GABAPENTIN 300 MG PO CAPS
300.0000 mg | ORAL_CAPSULE | Freq: Three times a day (TID) | ORAL | 3 refills | Status: AC
  Filled 2024-11-14: qty 90, 30d supply, fill #0
  Filled 2025-01-02: qty 90, 30d supply, fill #1

## 2024-11-14 MED ORDER — ARIPIPRAZOLE 5 MG PO TABS
5.0000 mg | ORAL_TABLET | Freq: Every day | ORAL | 1 refills | Status: AC
  Filled 2024-11-14: qty 60, 60d supply, fill #0
  Filled 2025-01-02: qty 60, 60d supply, fill #1

## 2024-11-14 MED ORDER — MELOXICAM 15 MG PO TABS
15.0000 mg | ORAL_TABLET | Freq: Every day | ORAL | 0 refills | Status: DC
  Filled 2024-11-14: qty 30, 30d supply, fill #0

## 2024-11-14 MED ORDER — TRAZODONE HCL 100 MG PO TABS
100.0000 mg | ORAL_TABLET | Freq: Every day | ORAL | 1 refills | Status: AC
  Filled 2024-11-14: qty 60, 60d supply, fill #0
  Filled 2025-01-02: qty 60, 60d supply, fill #1

## 2024-11-14 NOTE — Telephone Encounter (Signed)
 Please advise [facility]

## 2024-11-15 ENCOUNTER — Encounter: Payer: Self-pay | Admitting: Nurse Practitioner

## 2024-11-20 ENCOUNTER — Other Ambulatory Visit (HOSPITAL_COMMUNITY): Payer: Self-pay

## 2024-11-20 ENCOUNTER — Other Ambulatory Visit: Payer: Self-pay | Admitting: Nurse Practitioner

## 2024-11-20 DIAGNOSIS — F1994 Other psychoactive substance use, unspecified with psychoactive substance-induced mood disorder: Secondary | ICD-10-CM

## 2024-11-20 DIAGNOSIS — F419 Anxiety disorder, unspecified: Secondary | ICD-10-CM

## 2024-11-20 MED ORDER — HYDROXYZINE HCL 25 MG PO TABS
25.0000 mg | ORAL_TABLET | Freq: Three times a day (TID) | ORAL | 3 refills | Status: AC | PRN
  Filled 2024-11-20: qty 30, 10d supply, fill #0

## 2024-11-20 NOTE — Telephone Encounter (Signed)
 Please advise [facility]

## 2024-11-21 ENCOUNTER — Other Ambulatory Visit (HOSPITAL_COMMUNITY): Payer: Self-pay

## 2024-11-21 ENCOUNTER — Other Ambulatory Visit: Payer: Self-pay

## 2024-12-05 ENCOUNTER — Other Ambulatory Visit: Payer: Self-pay

## 2024-12-06 ENCOUNTER — Telehealth: Payer: Self-pay | Admitting: Nurse Practitioner

## 2024-12-06 NOTE — Telephone Encounter (Signed)
 Copied from CRM #[id number]. Topic: Referral - Request for Referral >> Dec 06, 2024 10:37 AM Wess RAMAN wrote: Did the patient discuss referral with their provider in the last year? Yes (If No - schedule appointment) (If Yes - send message)  Appointment offered? No  Type of order/referral and detailed reason for visit: Neurology  Preference of office, provider, location: [facility] [address], [place], [place] [address] Phone: [phone] Fax: [phone]  Or   Somewhere that has sooner availability before 12/18/24.  If referral order, have you been seen by this specialty before? No (If Yes, this issue or another issue? When? Where?  Can we respond through MyChart? Yes

## 2024-12-07 ENCOUNTER — Other Ambulatory Visit: Payer: Self-pay

## 2024-12-07 ENCOUNTER — Other Ambulatory Visit (HOSPITAL_COMMUNITY): Payer: Self-pay

## 2024-12-07 NOTE — Progress Notes (Signed)
 Specialty Pharmacy Refill Coordination Note  Edward Wolfe is a 38 y.o. male contacted today regarding refills of specialty medication(s) Emtricitabine -Tenofovir  DF (TRUVADA )   Patient requested Delivery   Delivery date: 12/12/24   Verified address: [address] [place] [address]   Medication will be filled on: 12/11/24

## 2024-12-07 NOTE — Telephone Encounter (Signed)
 Pt was given [facility] number. KH

## 2024-12-18 ENCOUNTER — Other Ambulatory Visit: Payer: Self-pay | Admitting: Nurse Practitioner

## 2024-12-18 ENCOUNTER — Telehealth: Payer: Self-pay | Admitting: Nurse Practitioner

## 2024-12-18 DIAGNOSIS — G8929 Other chronic pain: Secondary | ICD-10-CM

## 2024-12-18 NOTE — Telephone Encounter (Signed)
 Copied from CRM #[id number]. Topic: Referral - Request for Referral >> Dec 18, 2024  8:20 AM Delon HERO wrote: Patient is calling is request referral to be sent [facility]. Advised that referral was sent. However, [facility] states that the never received it. Please advise

## 2024-12-18 NOTE — Telephone Encounter (Signed)
 Please advise [facility]

## 2024-12-19 ENCOUNTER — Other Ambulatory Visit: Payer: Self-pay | Admitting: Nurse Practitioner

## 2024-12-19 DIAGNOSIS — G8929 Other chronic pain: Secondary | ICD-10-CM

## 2024-12-19 NOTE — Telephone Encounter (Signed)
 Please advise

## 2024-12-19 NOTE — Telephone Encounter (Signed)
 Copied from CRM #[id number]. Topic: Referral - Request for Referral >> Dec 18, 2024  8:20 AM Delon HERO wrote: Patient is calling is request referral to be sent [facility]. Advised that referral was sent. However, [facility] states that the never received it. Please advise >> Dec 19, 2024 11:35 AM Shanda MATSU wrote: Patient is calling back stating that he has already seen an orthopedic specialist who adv him that it would also be a good idea for him to see a neurologist which is why he is req a referral to neurology, patient also stated that he is waiting for MRI to be done, patient is req a call back in regards to referral to neurology.

## 2024-12-22 NOTE — Telephone Encounter (Signed)
 Pt has been advised that referral was placed to [facility]. He voiced his understanding.

## 2024-12-25 ENCOUNTER — Other Ambulatory Visit: Payer: Self-pay

## 2024-12-25 ENCOUNTER — Other Ambulatory Visit (HOSPITAL_COMMUNITY): Payer: Self-pay

## 2024-12-25 MED ORDER — MELOXICAM 15 MG PO TABS
15.0000 mg | ORAL_TABLET | Freq: Every day | ORAL | 1 refills | Status: AC
  Filled 2024-12-25: qty 30, 30d supply, fill #0

## 2024-12-26 ENCOUNTER — Ambulatory Visit: Payer: Self-pay | Admitting: Nurse Practitioner

## 2024-12-26 ENCOUNTER — Encounter: Payer: Self-pay | Admitting: Nurse Practitioner

## 2024-12-26 VITALS — BP 118/67 | HR 70 | Temp 97.3°F | Ht 72.0 in | Wt 257.0 lb

## 2024-12-26 DIAGNOSIS — Z720 Tobacco use: Secondary | ICD-10-CM

## 2024-12-26 DIAGNOSIS — F3162 Bipolar disorder, current episode mixed, moderate: Secondary | ICD-10-CM

## 2024-12-26 DIAGNOSIS — M5441 Lumbago with sciatica, right side: Secondary | ICD-10-CM | POA: Diagnosis not present

## 2024-12-26 DIAGNOSIS — F1210 Cannabis abuse, uncomplicated: Secondary | ICD-10-CM | POA: Diagnosis not present

## 2024-12-26 DIAGNOSIS — Z23 Encounter for immunization: Secondary | ICD-10-CM

## 2024-12-26 DIAGNOSIS — E669 Obesity, unspecified: Secondary | ICD-10-CM | POA: Diagnosis not present

## 2024-12-26 DIAGNOSIS — G8929 Other chronic pain: Secondary | ICD-10-CM

## 2024-12-26 DIAGNOSIS — Z113 Encounter for screening for infections with a predominantly sexual mode of transmission: Secondary | ICD-10-CM

## 2024-12-26 DIAGNOSIS — Z79899 Other long term (current) drug therapy: Secondary | ICD-10-CM | POA: Diagnosis not present

## 2024-12-26 DIAGNOSIS — Z13220 Encounter for screening for lipoid disorders: Secondary | ICD-10-CM | POA: Diagnosis not present

## 2024-12-26 DIAGNOSIS — Z5900 Homelessness unspecified: Secondary | ICD-10-CM | POA: Diagnosis not present

## 2024-12-26 DIAGNOSIS — F1911 Other psychoactive substance abuse, in remission: Secondary | ICD-10-CM | POA: Diagnosis not present

## 2024-12-26 DIAGNOSIS — F17200 Nicotine dependence, unspecified, uncomplicated: Secondary | ICD-10-CM | POA: Diagnosis not present

## 2024-12-26 DIAGNOSIS — F1994 Other psychoactive substance use, unspecified with psychoactive substance-induced mood disorder: Secondary | ICD-10-CM

## 2024-12-26 MED ORDER — NICOTINE 14 MG/24HR TD PT24
14.0000 mg | MEDICATED_PATCH | Freq: Every day | TRANSDERMAL | 0 refills | Status: AC
  Filled 2024-12-26: qty 28, 28d supply, fill #0

## 2024-12-26 MED ORDER — NICOTINE 7 MG/24HR TD PT24
7.0000 mg | MEDICATED_PATCH | Freq: Every day | TRANSDERMAL | 0 refills | Status: AC
  Filled 2024-12-26: qty 14, 14d supply, fill #0

## 2024-12-26 NOTE — Assessment & Plan Note (Signed)
" °  Methamphetamine use disorder, in remission Remains in remission. Reports no drug use since last visit. - Encouraged continued abstinence from drugs.  "

## 2024-12-26 NOTE — Assessment & Plan Note (Signed)
 Wt Readings from Last 3 Encounters:  12/26/24 257 lb (116.6 kg)  05/30/24 247 lb (112 kg)  01/24/24 239 lb (108.4 kg)   Body mass index is 34.86 kg/m.  Patient counseled on low-carb diet Encouraged moderate exercises at least 150 minutes weekly as tolerated Checking TSH and A1c

## 2024-12-26 NOTE — Assessment & Plan Note (Signed)
 Bipolar 1 disorder No new symptoms reported. Continue Abilify  5 mg daily, trazodone  100 mg at bedtime Maintain close follow-up with psychiatrist

## 2024-12-26 NOTE — Assessment & Plan Note (Signed)
 Housing instability Staying at [facility] after eviction. Social worker assisting with housing needs. - Continue coordination with child psychotherapist for housing assistance.

## 2024-12-26 NOTE — Assessment & Plan Note (Signed)
 Smokes about 2-3 cigarettes pack/day  Asked about quitting: confirms that he currently smokes cigarettes Advise to quit smoking: Educated about QUITTING to reduce the risk of cancer, cardio and cerebrovascular disease. Assess willingness: willing to quit at this time, and is working on cutting back. Assist with counseling and pharmacotherapy: Counseled for 3 minutes and literature provided. Arrange for follow up: follow up in 2 months and continue to offer help.   - Offered nicotine  patch to aid in smoking cessation.

## 2024-12-26 NOTE — Assessment & Plan Note (Addendum)
 Chronic low back pain Recent evaluation resulted in meloxicam  prescription and MRI order. Continue gabapentin  300 mg tablet daily, meloxicam  15 mg daily

## 2024-12-26 NOTE — Assessment & Plan Note (Signed)
" °  HIV pre-exposure prophylaxis Continues on Truvada . Not sexually active. - Continue Truvada  as prescribed. STD testing ordered  General health maintenance Discussed dietary habits and weight management. Advised on balanced diet and regular exercise. - Encouraged a diet with 70-80% vegetables and protein, low carbohydrates, and water instead of juice or soda. - Encouraged 30 minutes of moderate exercise daily. "

## 2024-12-26 NOTE — Progress Notes (Signed)
 "  Established Patient Office Visit  Subjective:  Patient ID: Edward Wolfe, male    DOB: [birth year]  Age: 39 y.o. MRN: [id number]  CC:  Chief Complaint  Patient presents with   Medical Management of Chronic Issues   SDOH    Pt currently in shelter and pt advised of [facility]    HPI   Discussed the use of AI scribe software for clinical note transcription with the patient, who gave verbal consent to proceed.  History of Present Illness Edward Wolfe CD is a 39 year old male  has a past medical history of Amphetamine and psychostimulant-induced psychosis with delusions (HCC), Bipolar 1 disorder (HCC), Bipolar disorder (HCC) (10/29/2021), Bipolar I disorder, most recent episode depressed (HCC), GAD (generalized anxiety disorder) (11/04/2021), and Hepatitis C. who presents for follow-up of back pain and medication management.   He has persistent back pain, which was evaluated yesterday by orthopedics where he was prescribed meloxicam  15 mg daily, MRI was also ordered  His current medications include Truvada  once daily, Zoloft  100 mg daily, trazodone  100 mg daily, hydroxyzine  25 mg three times daily as needed, Abilify  5 mg daily, gabapentin  300 mg three times daily, and meloxicam  as needed. He plans to discuss medication management with his psychiatrist at the [facility].  He has gained weight since his last visit six months ago, which he attributes to increased food intake. He is attempting to quit smoking and currently smokes two to three cigarettes a day. He confirms that he is no longer using drugs, stating 'I'm totally done.'  [facility] and is working with a child psychotherapist to find more permanent housing.  No chest pain, shortness of breath, abdominal pain, nausea, vomiting, or any wounds.   Assessment & Plan      Lab Results  Component Value Date   CHOL 204 (H) 10/30/2021   HDL 45 10/30/2021    LDLCALC 136 (H) 10/30/2021   TRIG 113 10/30/2021   CHOLHDL 4.5 10/30/2021     Wt Readings from Last 3 Encounters:  12/26/24 257 lb (116.6 kg)  05/30/24 247 lb (112 kg)  01/24/24 239 lb (108.4 kg)      Past Medical History:  Diagnosis Date   Amphetamine and psychostimulant-induced psychosis with delusions (HCC)    Bipolar 1 disorder (HCC)    Bipolar disorder (HCC) 10/29/2021   Bipolar I disorder, most recent episode depressed (HCC)    GAD (generalized anxiety disorder) 11/04/2021   Hepatitis C     Past Surgical History:  Procedure Laterality Date   HERNIA REPAIR     tube in ear      Family History  Problem Relation Age of Onset   Hypertension Mother    Cancer Father    Hypertension Father    High Cholesterol Father     Social History   Socioeconomic History   Marital status: Single    Spouse name: Not on file   Number of children: Not on file   Years of education: Not on file   Highest education level: Not on file  Occupational History   Not on file  Tobacco Use   Smoking status: Every Day    Current packs/day: 1.00    Types: Cigarettes    Passive exposure: Never   Smokeless tobacco: Never  Vaping Use   Vaping status: Never Used  Substance and Sexual Activity   Alcohol  use: Yes  Comment: Socially   Drug use: Not Currently    Types: Marijuana, Methamphetamines   Sexual activity: Yes    Birth control/protection: Condom  Other Topics Concern   Not on file  Social History Narrative   Lives with his boyfriend.    Social Drivers of Health   Tobacco Use: High Risk (12/26/2024)   Patient History    Smoking Tobacco Use: Every Day    Smokeless Tobacco Use: Never    Passive Exposure: Never  Financial Resource Strain: High Risk (06/07/2024)   Overall Financial Resource Strain (CARDIA)    Difficulty of Paying Living Expenses: Very hard  Food Insecurity: No Food Insecurity (12/26/2024)   Epic    Worried About Programme Researcher, Broadcasting/film/video in the Last Year: Never true     Ran Out of Food in the Last Year: Never true  Transportation Needs: No Transportation Needs (12/26/2024)   Epic    Lack of Transportation (Medical): No    Lack of Transportation (Non-Medical): No  Physical Activity: Not on file  Stress: Not on file  Social Connections: Unknown (02/17/2023)   Received from [facility]   Social Network    Social Network: Not on file  Intimate Partner Violence: Not At Risk (12/26/2024)   Epic    Fear of Current or Ex-Partner: No    Emotionally Abused: No    Physically Abused: No    Sexually Abused: No  Depression (PHQ2-9): Low Risk (12/26/2024)   Depression (PHQ2-9)    PHQ-2 Score: 2  Alcohol  Screen: Not on file  Housing: High Risk (12/26/2024)   Epic    Unable to Pay for Housing in the Last Year: Yes    Number of Times Moved in the Last Year: 13    Homeless in the Last Year: Yes  Utilities: Not At Risk (12/26/2024)   Epic    Threatened with loss of utilities: No  Health Literacy: Not on file    Outpatient Medications Prior to Visit  Medication Sig Dispense Refill   ARIPiprazole  (ABILIFY ) 5 MG tablet Take 1 tablet (5 mg total) by mouth daily. 60 tablet 1   emtricitabine -tenofovir  (TRUVADA ) 200-300 MG tablet Take 1 tablet by mouth daily. 90 tablet 1   gabapentin  (NEURONTIN ) 300 MG capsule Take 1 capsule (300 mg total) by mouth 3 (three) times daily. 90 capsule 3   hydrOXYzine  (ATARAX ) 25 MG tablet Take 1 tablet (25 mg total) by mouth 3 (three) times daily as needed for anxiety. 30 tablet 3   meloxicam  (MOBIC ) 15 MG tablet Take 1 tablet (15 mg total) by mouth daily. 30 tablet 1   sertraline  (ZOLOFT ) 100 MG tablet Take 1 tablet (100 mg total) by mouth daily. 60 tablet 1   traZODone  (DESYREL ) 100 MG tablet Take 1 tablet (100 mg total) by mouth at bedtime. 60 tablet 1   meloxicam  (MOBIC ) 15 MG tablet Take 1 tablet (15 mg total) by mouth daily as needed for pain. 30 tablet 1   meloxicam  (MOBIC ) 15 MG tablet Take 1 tablet (15 mg total) by mouth daily. 30  tablet 0   No facility-administered medications prior to visit.    Allergies[1]  ROS Review of Systems  Constitutional:  Negative for appetite change, chills, fatigue and fever.  HENT:  Negative for congestion, postnasal drip, rhinorrhea and sneezing.   Respiratory:  Negative for cough, shortness of breath and wheezing.   Cardiovascular:  Negative for chest pain, palpitations and leg swelling.  Gastrointestinal:  Negative for abdominal pain, constipation,  nausea and vomiting.  Genitourinary:  Negative for difficulty urinating, dysuria, flank pain and frequency.  Musculoskeletal:  Positive for arthralgias and back pain. Negative for joint swelling and myalgias.  Skin:  Negative for color change, pallor, rash and wound.  Neurological:  Negative for dizziness, facial asymmetry, weakness, numbness and headaches.  Psychiatric/Behavioral:  Negative for behavioral problems, confusion, self-injury and suicidal ideas.       Objective:    Physical Exam Vitals and nursing note reviewed.  Constitutional:      General: He is not in acute distress.    Appearance: Normal appearance. He is obese. He is not ill-appearing, toxic-appearing or diaphoretic.  Eyes:     General: No scleral icterus.       Right eye: No discharge.        Left eye: No discharge.     Extraocular Movements: Extraocular movements intact.     Conjunctiva/sclera: Conjunctivae normal.  Cardiovascular:     Rate and Rhythm: Normal rate and regular rhythm.     Pulses: Normal pulses.     Heart sounds: Normal heart sounds. No murmur heard.    No friction rub. No gallop.  Pulmonary:     Effort: Pulmonary effort is normal. No respiratory distress.     Breath sounds: Normal breath sounds. No stridor. No wheezing, rhonchi or rales.  Chest:     Chest wall: No tenderness.  Abdominal:     General: There is no distension.     Palpations: Abdomen is soft.     Tenderness: There is no abdominal tenderness. There is no right CVA  tenderness, left CVA tenderness or guarding.  Musculoskeletal:        General: No swelling, tenderness, deformity or signs of injury.     Right lower leg: No edema.     Left lower leg: No edema.     Comments: Tenderness on palpation of left side of low back, is in a rolling walker for ambulation  Skin:    General: Skin is warm and dry.     Capillary Refill: Capillary refill takes less than 2 seconds.     Coloration: Skin is not jaundiced or pale.     Findings: No bruising, erythema or lesion.  Neurological:     Mental Status: He is alert and oriented to person, place, and time.     Gait: Gait abnormal.  Psychiatric:        Mood and Affect: Mood normal.        Behavior: Behavior normal.        Thought Content: Thought content normal.        Judgment: Judgment normal.     BP 118/67   Pulse 70   Temp (!) 97.3 F (36.3 C)   Ht 6' (1.829 m)   Wt 257 lb (116.6 kg)   SpO2 98%   BMI 34.86 kg/m  Wt Readings from Last 3 Encounters:  12/26/24 257 lb (116.6 kg)  05/30/24 247 lb (112 kg)  01/24/24 239 lb (108.4 kg)    Lab Results  Component Value Date   TSH 1.831 10/30/2021   Lab Results  Component Value Date   WBC 6.4 11/01/2023   HGB 15.3 11/01/2023   HCT 46.1 11/01/2023   MCV 85 11/01/2023   PLT 220 11/01/2023   Lab Results  Component Value Date   NA 141 05/30/2024   K 4.1 05/30/2024   CO2 21 05/30/2024   GLUCOSE 81 05/30/2024   BUN 7 05/30/2024  CREATININE 0.83 05/30/2024   BILITOT 0.5 11/01/2023   ALKPHOS 83 11/01/2023   AST 17 11/01/2023   ALT 18 11/01/2023   PROT 6.7 11/01/2023   ALBUMIN 4.3 11/01/2023   CALCIUM 9.7 05/30/2024   ANIONGAP 8 09/10/2022   EGFR 114 05/30/2024   Lab Results  Component Value Date   CHOL 204 (H) 10/30/2021   Lab Results  Component Value Date   HDL 45 10/30/2021   Lab Results  Component Value Date   LDLCALC 136 (H) 10/30/2021   Lab Results  Component Value Date   TRIG 113 10/30/2021   Lab Results  Component Value  Date   CHOLHDL 4.5 10/30/2021   Lab Results  Component Value Date   HGBA1C 5.1 10/30/2021      Assessment & Plan:   Problem List Items Addressed This Visit       Nervous and Auditory   Substance induced mood disorder (HCC)   Continue Zoloft  100 mg daily, trazodone  100 mg daily        Other   Bipolar 1 disorder, mixed, moderate (HCC)   Bipolar 1 disorder No new symptoms reported. Continue Abilify  5 mg daily, trazodone  100 mg at bedtime Maintain close follow-up with psychiatrist        Tobacco use disorder, mild, abuse   Smokes about 2-3 cigarettes pack/day  Asked about quitting: confirms that he currently smokes cigarettes Advise to quit smoking: Educated about QUITTING to reduce the risk of cancer, cardio and cerebrovascular disease. Assess willingness: willing to quit at this time, and is working on cutting back. Assist with counseling and pharmacotherapy: Counseled for 3 minutes and literature provided. Arrange for follow up: follow up in 2 months and continue to offer help.   - Offered nicotine  patch to aid in smoking cessation.       Relevant Medications   nicotine  (NICODERM CQ  - DOSED IN MG/24 HOURS) 14 mg/24hr patch   nicotine  (NICODERM CQ  - DOSED IN MG/24 HR) 7 mg/24hr patch (Start on 02/06/2025)   Chronic low back pain   Chronic low back pain Recent evaluation resulted in meloxicam  prescription and MRI order. Continue gabapentin  300 mg tablet daily, meloxicam  15 mg daily       Tobacco abuse   On pre-exposure prophylaxis for HIV - Primary    HIV pre-exposure prophylaxis Continues on Truvada . Not sexually active. - Continue Truvada  as prescribed. STD testing ordered  General health maintenance Discussed dietary habits and weight management. Advised on balanced diet and regular exercise. - Encouraged a diet with 70-80% vegetables and protein, low carbohydrates, and water instead of juice or soda. - Encouraged 30 minutes of moderate exercise daily.       Relevant Orders   CMP14+EGFR   Marijuana abuse   Need for influenza vaccination   Relevant Orders   Flu vaccine trivalent PF, 6mos and older(Flulaval,Afluria,Fluarix,Fluzone) (Completed)   Homelessness   [facility] instability [facility] Social worker assisting with housing needs. - Continue coordination with child psychotherapist for housing assistance.      History of substance abuse (HCC)    Methamphetamine use disorder, in remission Remains in remission. Reports no drug use since last visit. - Encouraged continued abstinence from drugs.       Obesity (BMI 30-39.9)   Wt Readings from Last 3 Encounters:  12/26/24 257 lb (116.6 kg)  05/30/24 247 lb (112 kg)  01/24/24 239 lb (108.4 kg)   Body mass index is 34.86 kg/m.  Patient  counseled on low-carb diet Encouraged moderate exercises at least 150 minutes weekly as tolerated Checking TSH and A1c      Relevant Orders   TSH   Hemoglobin A1c   Other Visit Diagnoses       Screening for lipid disorders         Screen for STD (sexually transmitted disease)       Relevant Orders   RPR   HepB+HepC+HIV Panel   Chlamydia/Gonococcus/Trichomonas, NAA       Meds ordered this encounter  Medications   nicotine  (NICODERM CQ  - DOSED IN MG/24 HOURS) 14 mg/24hr patch    Sig: Place 1 patch (14 mg total) onto the skin daily.    Dispense:  42 patch    Refill:  0   nicotine  (NICODERM CQ  - DOSED IN MG/24 HR) 7 mg/24hr patch    Sig: Place 1 patch (7 mg total) onto the skin daily.    Dispense:  14 patch    Refill:  0    Follow-up: Return in about 2 months (around 02/23/2025) for smoking cessation, CPE.    Kameren Pargas R Yoshiko Keleher, FNP     [1]  Allergies Allergen Reactions   Morphine Other (See Comments)    does the opposite effect, turns me into a zombie   Morphine And Codeine     made him a zombie   "

## 2024-12-26 NOTE — Assessment & Plan Note (Signed)
 Continue Zoloft  100 mg daily, trazodone  100 mg daily

## 2024-12-26 NOTE — Patient Instructions (Signed)
 1. Housing instability (Primary)  2. Tobacco abuse - nicotine  (NICODERM CQ  - DOSED IN MG/24 HOURS) 14 mg/24hr patch; Place 1 patch (14 mg total) onto the skin daily.  Dispense: 42 patch; Refill: 0 - nicotine  (NICODERM CQ  - DOSED IN MG/24 HR) 7 mg/24hr patch; Place 1 patch (7 mg total) onto the skin daily.  Dispense: 14 patch; Refill: 0  3. On pre-exposure prophylaxis for HIV - CMP14+EGFR  4. Screening for lipid disorders  5. Screen for STD (sexually transmitted disease) - RPR - HepB+HepC+HIV Panel - Chlamydia/Gonococcus/Trichomonas, NAA  6. Obesity (BMI 30-39.9) - TSH - Hemoglobin A1c  7. Need for influenza vaccination - Flu vaccine trivalent PF, 6mos and older(Flulaval,Afluria,Fluarix,Fluzone)     It is important that you exercise regularly at least 30 minutes 5 times a week as tolerated  Think about what you will eat, plan ahead. Choose  clean, green, fresh or frozen over canned, processed or packaged foods which are more sugary, salty and fatty. 70 to 75% of food eaten should be vegetables and fruit. Three meals at set times with snacks allowed between meals, but they must be fruit or vegetables. Aim to eat over a 12 hour period , example 7 am to 7 pm, and STOP after  your last meal of the day. Drink water,generally about 64 ounces per day, no other drink is as healthy. Fruit juice is best enjoyed in a healthy way, by EATING the fruit.  Thanks for choosing [facility] we consider it a privelige to serve you.

## 2024-12-27 ENCOUNTER — Other Ambulatory Visit: Payer: Self-pay

## 2024-12-27 ENCOUNTER — Other Ambulatory Visit (HOSPITAL_COMMUNITY): Payer: Self-pay

## 2024-12-27 ENCOUNTER — Ambulatory Visit: Payer: Self-pay | Admitting: Nurse Practitioner

## 2024-12-27 LAB — HEPB+HEPC+HIV PANEL
HIV Screen 4th Generation wRfx: NONREACTIVE
Hep B C IgM: NEGATIVE
Hep B Core Total Ab: NEGATIVE
Hep B E Ab: NONREACTIVE
Hep B E Ag: NEGATIVE
Hep B Surface Ab, Qual: REACTIVE
Hep C Virus Ab: NONREACTIVE
Hepatitis B Surface Ag: NEGATIVE

## 2024-12-27 LAB — CMP14+EGFR
ALT: 19 IU/L (ref 0–44)
AST: 18 IU/L (ref 0–40)
Albumin: 4.2 g/dL (ref 4.1–5.1)
Alkaline Phosphatase: 97 IU/L (ref 47–123)
BUN/Creatinine Ratio: 16 (ref 9–20)
BUN: 12 mg/dL (ref 6–20)
Bilirubin Total: 0.4 mg/dL (ref 0.0–1.2)
CO2: 26 mmol/L (ref 20–29)
Calcium: 9.9 mg/dL (ref 8.7–10.2)
Chloride: 100 mmol/L (ref 96–106)
Creatinine, Ser: 0.77 mg/dL (ref 0.76–1.27)
Globulin, Total: 2.8 g/dL (ref 1.5–4.5)
Glucose: 93 mg/dL (ref 70–99)
Potassium: 4.5 mmol/L (ref 3.5–5.2)
Sodium: 141 mmol/L (ref 134–144)
Total Protein: 7 g/dL (ref 6.0–8.5)
eGFR: 117 mL/min/1.73

## 2024-12-27 LAB — TSH: TSH: 1.56 u[IU]/mL (ref 0.450–4.500)

## 2024-12-27 LAB — HEMOGLOBIN A1C
Est. average glucose Bld gHb Est-mCnc: 105 mg/dL
Hgb A1c MFr Bld: 5.3 % (ref 4.8–5.6)

## 2024-12-27 LAB — SYPHILIS: RPR W/REFLEX TO RPR TITER AND TREPONEMAL ANTIBODIES, TRADITIONAL SCREENING AND DIAGNOSIS ALGORITHM: RPR Ser Ql: NONREACTIVE

## 2024-12-28 ENCOUNTER — Other Ambulatory Visit: Payer: Self-pay

## 2024-12-28 LAB — CHLAMYDIA/GONOCOCCUS/TRICHOMONAS, NAA
Chlamydia by NAA: NEGATIVE
Gonococcus by NAA: NEGATIVE
Trich vag by NAA: NEGATIVE

## 2025-01-02 ENCOUNTER — Other Ambulatory Visit: Payer: Self-pay

## 2025-01-02 ENCOUNTER — Other Ambulatory Visit (HOSPITAL_COMMUNITY): Payer: Self-pay

## 2025-01-02 ENCOUNTER — Other Ambulatory Visit: Payer: Self-pay | Admitting: Nurse Practitioner

## 2025-01-02 DIAGNOSIS — F1994 Other psychoactive substance use, unspecified with psychoactive substance-induced mood disorder: Secondary | ICD-10-CM

## 2025-01-02 MED ORDER — SERTRALINE HCL 100 MG PO TABS
100.0000 mg | ORAL_TABLET | Freq: Every day | ORAL | 1 refills | Status: AC
  Filled 2025-01-02: qty 60, 60d supply, fill #0

## 2025-01-02 NOTE — Telephone Encounter (Signed)
 Please advise [facility]

## 2025-01-02 NOTE — Telephone Encounter (Signed)
sertraline (ZOLOFT) 100 MG tablet

## 2025-01-03 ENCOUNTER — Other Ambulatory Visit: Payer: Self-pay

## 2025-01-05 ENCOUNTER — Other Ambulatory Visit: Payer: Self-pay

## 2025-01-16 ENCOUNTER — Encounter: Payer: Self-pay | Admitting: Nurse Practitioner

## 2025-01-18 ENCOUNTER — Ambulatory Visit: Payer: Self-pay | Admitting: Nurse Practitioner

## 2025-01-22 ENCOUNTER — Ambulatory Visit: Payer: Self-pay | Admitting: Nurse Practitioner

## 2025-01-24 ENCOUNTER — Other Ambulatory Visit: Payer: Self-pay

## 2025-01-25 ENCOUNTER — Other Ambulatory Visit (HOSPITAL_COMMUNITY): Payer: Self-pay

## 2025-01-25 ENCOUNTER — Other Ambulatory Visit: Payer: Self-pay

## 2025-01-25 ENCOUNTER — Other Ambulatory Visit: Payer: Self-pay | Admitting: Pharmacy Technician

## 2025-01-26 ENCOUNTER — Telehealth: Payer: Self-pay | Admitting: Nurse Practitioner

## 2025-01-26 NOTE — Telephone Encounter (Signed)
 Called pt and LVM to confirm what ID card he is looking for and to confirm the address.  Copied from CRM [id number]. Topic: General - Other >> Jan 26, 2025  8:56 AM Edward Wolfe wrote: Reason for CRM: Patient called in requesting a copy of his ID card. Also is requesting it be mailed to him.

## 2025-03-23 ENCOUNTER — Encounter: Payer: Self-pay | Admitting: Nurse Practitioner

## 2025-05-21 ENCOUNTER — Encounter: Admitting: Nurse Practitioner
